# Patient Record
Sex: Female | Born: 1945 | Smoking: Never smoker
Health system: Southern US, Community
[De-identification: ages and names within clinical notes are randomized; demographics above are authoritative.]

## PROBLEM LIST (undated history)

## (undated) DIAGNOSIS — E039 Hypothyroidism, unspecified: Secondary | ICD-10-CM

## (undated) DIAGNOSIS — R079 Chest pain, unspecified: Secondary | ICD-10-CM

## (undated) DIAGNOSIS — M65331 Trigger finger, right middle finger: Secondary | ICD-10-CM

## (undated) DIAGNOSIS — E785 Hyperlipidemia, unspecified: Secondary | ICD-10-CM

## (undated) DIAGNOSIS — Z9889 Other specified postprocedural states: Secondary | ICD-10-CM

## (undated) DIAGNOSIS — R030 Elevated blood-pressure reading, without diagnosis of hypertension: Secondary | ICD-10-CM

## (undated) HISTORY — DX: Other specified postprocedural states: Z98.890

## (undated) HISTORY — DX: Trigger finger, right middle finger: M65.331

## (undated) HISTORY — DX: Chest pain, unspecified: R07.9

## (undated) HISTORY — PX: TRIGGER FINGER RELEASE: SHX641

## (undated) HISTORY — DX: Elevated blood-pressure reading, without diagnosis of hypertension: R03.0

---

## 2011-09-17 ENCOUNTER — Other Ambulatory Visit (HOSPITAL_COMMUNITY)
Admission: RE | Admit: 2011-09-17 | Discharge: 2011-09-17 | Disposition: A | Discharge status: Home / Self Care | Payer: Managed Care, Other (non HMO) | Source: Ambulatory Visit | Attending: Family Medicine | Admitting: Family Medicine

## 2011-09-17 DIAGNOSIS — Z124 Encounter for screening for malignant neoplasm of cervix: Secondary | ICD-10-CM | POA: Insufficient documentation

## 2014-09-25 ENCOUNTER — Other Ambulatory Visit (HOSPITAL_COMMUNITY)
Admission: RE | Admit: 2014-09-25 | Discharge: 2014-09-25 | Disposition: A | Discharge status: Home / Self Care | Payer: Medicare Other | Source: Ambulatory Visit | Attending: Family Medicine | Admitting: Family Medicine

## 2014-09-25 ENCOUNTER — Other Ambulatory Visit: Payer: Self-pay | Admitting: Family Medicine

## 2014-09-25 DIAGNOSIS — Z124 Encounter for screening for malignant neoplasm of cervix: Secondary | ICD-10-CM | POA: Diagnosis present

## 2014-09-30 LAB — CYTOLOGY - PAP

## 2017-12-26 ENCOUNTER — Ambulatory Visit (INDEPENDENT_AMBULATORY_CARE_PROVIDER_SITE_OTHER): Payer: Medicare Other | Admitting: Orthopaedic Surgery

## 2017-12-26 ENCOUNTER — Encounter (INDEPENDENT_AMBULATORY_CARE_PROVIDER_SITE_OTHER): Payer: Self-pay | Admitting: Orthopaedic Surgery

## 2017-12-26 DIAGNOSIS — M65331 Trigger finger, right middle finger: Secondary | ICD-10-CM | POA: Insufficient documentation

## 2017-12-26 HISTORY — DX: Trigger finger, right middle finger: M65.331

## 2017-12-26 NOTE — Progress Notes (Signed)
   Office Visit Note   Patient: Yolanda Webb           Date of Birth: Dec 29, 1945           MRN: [id number] Visit Date: 12/26/2017              Requested by: No referring provider defined for this encounter. PCP: Leighton Ruff, MD   Assessment & Plan: Visit Diagnoses:  1. Trigger finger, right middle finger     Plan: Impression is 72 year old female with recurrent right middle trigger finger.  At this point she wants to pursue surgical release and tenolysis of tendons.  We discussed the details of the surgery as well as the risk benefits and alternatives.  She understands and wishes to proceed.  We will get her scheduled at her convenience.   Follow-Up Instructions: Return for 2 week postop visit.   Orders:  No orders of the defined types were placed in this encounter.  No orders of the defined types were placed in this encounter.     Procedures: No procedures performed   Clinical Data: No additional findings.   Subjective: Chief Complaint  Patient presents with  . Right Middle Finger - Pain    Patient is following up today for recurrent right trigger middle finger.  She had partial relief from the previous injection.  She is interested in discussing surgery today.    Review of Systems   Objective: Vital Signs: There were no vitals taken for this visit.  Physical Exam  Ortho Exam Right hand exam shows triggering of the middle finger with pain and discomfort along the flexor tendon. Specialty Comments:  No specialty comments available.  Imaging: No results found.   PMFS History: Patient Active Problem List   Diagnosis Date Noted  . Trigger finger, right middle finger 12/26/2017   History reviewed. No pertinent past medical history.  History reviewed. No pertinent family history.  History reviewed. No pertinent surgical history. Social History   Occupational History  . Not on file  Tobacco Use  . Smoking status: Never Smoker  . Smokeless  tobacco: Never Used  Substance and Sexual Activity  . Alcohol use: Not on file  . Drug use: Not on file  . Sexual activity: Not on file

## 2018-01-06 ENCOUNTER — Other Ambulatory Visit: Payer: Self-pay

## 2018-01-06 DIAGNOSIS — M659 Synovitis and tenosynovitis, unspecified: Secondary | ICD-10-CM | POA: Diagnosis not present

## 2018-01-06 DIAGNOSIS — M65331 Trigger finger, right middle finger: Secondary | ICD-10-CM | POA: Diagnosis not present

## 2018-01-17 ENCOUNTER — Ambulatory Visit (INDEPENDENT_AMBULATORY_CARE_PROVIDER_SITE_OTHER): Payer: Medicare Other | Admitting: Orthopaedic Surgery

## 2018-01-17 ENCOUNTER — Encounter (INDEPENDENT_AMBULATORY_CARE_PROVIDER_SITE_OTHER): Payer: Self-pay | Admitting: Orthopaedic Surgery

## 2018-01-17 DIAGNOSIS — Z9889 Other specified postprocedural states: Secondary | ICD-10-CM

## 2018-01-17 HISTORY — DX: Other specified postprocedural states: Z98.890

## 2018-01-17 NOTE — Progress Notes (Signed)
   Post-Op Visit Note   Patient: Yolanda Webb           Date of Birth: [birth year]           MRN: [id number] Visit Date: 01/17/2018 PCP: Leighton Ruff, MD   Assessment & Plan:  Chief Complaint: No chief complaint on file.  Visit Diagnoses:  1. Status post trigger finger release     Plan: Jennefer comes in for follow-up.  11 days status post A1 pulley release right hand long finger date of surgery 01/06/2018.  She is doing very well.  Minimal to no pain.  No fevers chills or any other systemic symptoms.  She does have a a well-healing surgical incision without evidence of infection.  Nylon sutures intact.  She is able to fully extend and flex her right long finger.  She is neurovascular intact distally.  Today we will remove her nylon stitches and apply Steri-Strips.  We will provide her with range of motion exercises.  She will follow-up with [place] in 4-5 weeks time for repeat wound check.  Follow-Up Instructions: Return in about 5 years (around 01/17/2023).   Orders:  No orders of the defined types were placed in this encounter.  No orders of the defined types were placed in this encounter.   Imaging: No results found.  PMFS History: Patient Active Problem List   Diagnosis Date Noted  . Status post trigger finger release 01/17/2018  . Trigger finger, right middle finger 12/26/2017   History reviewed. No pertinent past medical history.  History reviewed. No pertinent family history.  History reviewed. No pertinent surgical history. Social History   Occupational History  . Not on file  Tobacco Use  . Smoking status: Never Smoker  . Smokeless tobacco: Never Used  Substance and Sexual Activity  . Alcohol use: Not on file  . Drug use: Not on file  . Sexual activity: Not on file

## 2018-02-14 ENCOUNTER — Ambulatory Visit (INDEPENDENT_AMBULATORY_CARE_PROVIDER_SITE_OTHER): Payer: Medicare Other | Admitting: Orthopaedic Surgery

## 2018-02-24 ENCOUNTER — Ambulatory Visit (INDEPENDENT_AMBULATORY_CARE_PROVIDER_SITE_OTHER): Payer: Medicare Other | Admitting: Orthopaedic Surgery

## 2018-02-27 ENCOUNTER — Encounter (INDEPENDENT_AMBULATORY_CARE_PROVIDER_SITE_OTHER): Payer: Self-pay | Admitting: Orthopaedic Surgery

## 2018-02-27 ENCOUNTER — Ambulatory Visit (INDEPENDENT_AMBULATORY_CARE_PROVIDER_SITE_OTHER): Payer: Medicare Other | Admitting: Orthopaedic Surgery

## 2018-02-27 DIAGNOSIS — Z9889 Other specified postprocedural states: Secondary | ICD-10-CM

## 2018-02-27 DIAGNOSIS — M65331 Trigger finger, right middle finger: Secondary | ICD-10-CM

## 2018-02-27 NOTE — Progress Notes (Signed)
Patient is 2 months status post right middle finger trigger release.  She is overall doing well.  Just complaining of some soreness around the surgical scar.  She no longer has triggering.  She is unable to fully extend her middle finger but this is not really overly bothersome.  From my standpoint everything is healed well.  She is doing well she is happy with her progress.  I will see her back as needed.

## 2018-10-31 ENCOUNTER — Observation Stay (HOSPITAL_COMMUNITY)
Admission: EM | Admit: 2018-10-31 | Discharge: 2018-11-01 | Disposition: A | Discharge status: Home / Self Care | Payer: Medicare Other | Attending: Internal Medicine | Admitting: Internal Medicine

## 2018-10-31 ENCOUNTER — Other Ambulatory Visit: Payer: Self-pay

## 2018-10-31 ENCOUNTER — Emergency Department (HOSPITAL_COMMUNITY): Payer: Medicare Other

## 2018-10-31 ENCOUNTER — Encounter (HOSPITAL_COMMUNITY): Payer: Self-pay

## 2018-10-31 DIAGNOSIS — Z8249 Family history of ischemic heart disease and other diseases of the circulatory system: Secondary | ICD-10-CM | POA: Insufficient documentation

## 2018-10-31 DIAGNOSIS — R0789 Other chest pain: Secondary | ICD-10-CM | POA: Diagnosis present

## 2018-10-31 DIAGNOSIS — R079 Chest pain, unspecified: Secondary | ICD-10-CM | POA: Diagnosis present

## 2018-10-31 DIAGNOSIS — E785 Hyperlipidemia, unspecified: Secondary | ICD-10-CM | POA: Diagnosis not present

## 2018-10-31 DIAGNOSIS — R030 Elevated blood-pressure reading, without diagnosis of hypertension: Secondary | ICD-10-CM

## 2018-10-31 DIAGNOSIS — I10 Essential (primary) hypertension: Secondary | ICD-10-CM | POA: Diagnosis not present

## 2018-10-31 DIAGNOSIS — M79602 Pain in left arm: Secondary | ICD-10-CM | POA: Insufficient documentation

## 2018-10-31 DIAGNOSIS — Z880 Allergy status to penicillin: Secondary | ICD-10-CM | POA: Insufficient documentation

## 2018-10-31 DIAGNOSIS — E039 Hypothyroidism, unspecified: Secondary | ICD-10-CM | POA: Diagnosis not present

## 2018-10-31 DIAGNOSIS — Z79899 Other long term (current) drug therapy: Secondary | ICD-10-CM | POA: Insufficient documentation

## 2018-10-31 HISTORY — DX: Hyperlipidemia, unspecified: E78.5

## 2018-10-31 HISTORY — DX: Hypothyroidism, unspecified: E03.9

## 2018-10-31 HISTORY — DX: Chest pain, unspecified: R07.9

## 2018-10-31 HISTORY — DX: Elevated blood-pressure reading, without diagnosis of hypertension: R03.0

## 2018-10-31 LAB — COMPREHENSIVE METABOLIC PANEL
ALT: 20 U/L (ref 0–44)
ANION GAP: 8 (ref 5–15)
AST: 23 U/L (ref 15–41)
Albumin: 4.4 g/dL (ref 3.5–5.0)
Alkaline Phosphatase: 76 U/L (ref 38–126)
BUN: 15 mg/dL (ref 8–23)
CHLORIDE: 108 mmol/L (ref 98–111)
CO2: 24 mmol/L (ref 22–32)
CREATININE: 1 mg/dL (ref 0.44–1.00)
Calcium: 10.5 mg/dL — ABNORMAL HIGH (ref 8.9–10.3)
GFR calc non Af Amer: 56 mL/min — ABNORMAL LOW (ref >60)
Glucose, Bld: 102 mg/dL — ABNORMAL HIGH (ref 70–99)
POTASSIUM: 3.8 mmol/L (ref 3.5–5.1)
SODIUM: 140 mmol/L (ref 135–145)
Total Bilirubin: 0.5 mg/dL (ref 0.3–1.2)
Total Protein: 7.5 g/dL (ref 6.5–8.1)

## 2018-10-31 LAB — CBC
HEMATOCRIT: 40.1 % (ref 36.0–46.0)
HEMOGLOBIN: 13.3 g/dL (ref 12.0–15.0)
MCH: 29.6 pg (ref 26.0–34.0)
MCHC: 33.2 g/dL (ref 30.0–36.0)
MCV: 89.3 fL (ref 80.0–100.0)
Platelets: 256 10*3/uL (ref 150–400)
RBC: 4.49 MIL/uL (ref 3.87–5.11)
RDW: 11.9 % (ref 11.5–15.5)
WBC: 7.2 10*3/uL (ref 4.0–10.5)
nRBC: 0 % (ref 0.0–0.2)

## 2018-10-31 LAB — RAPID URINE DRUG SCREEN, HOSP PERFORMED
Amphetamines: NOT DETECTED
BARBITURATES: NOT DETECTED
BENZODIAZEPINES: NOT DETECTED
COCAINE: NOT DETECTED
Opiates: NOT DETECTED
TETRAHYDROCANNABINOL: NOT DETECTED

## 2018-10-31 LAB — CBC WITH DIFFERENTIAL/PLATELET
ABS IMMATURE GRANULOCYTES: 0.03 10*3/uL (ref 0.00–0.07)
BASOS ABS: 0.1 10*3/uL (ref 0.0–0.1)
Basophils Relative: 1 %
Eosinophils Absolute: 0.1 10*3/uL (ref 0.0–0.5)
Eosinophils Relative: 2 %
HEMATOCRIT: 44 % (ref 36.0–46.0)
HEMOGLOBIN: 14.6 g/dL (ref 12.0–15.0)
IMMATURE GRANULOCYTES: 0 %
LYMPHS ABS: 1.6 10*3/uL (ref 0.7–4.0)
LYMPHS PCT: 23 %
MCH: 29.7 pg (ref 26.0–34.0)
MCHC: 33.2 g/dL (ref 30.0–36.0)
MCV: 89.4 fL (ref 80.0–100.0)
Monocytes Absolute: 0.8 10*3/uL (ref 0.1–1.0)
Monocytes Relative: 12 %
NEUTROS ABS: 4.3 10*3/uL (ref 1.7–7.7)
NEUTROS PCT: 62 %
NRBC: 0 % (ref 0.0–0.2)
Platelets: 291 10*3/uL (ref 150–400)
RBC: 4.92 MIL/uL (ref 3.87–5.11)
RDW: 11.8 % (ref 11.5–15.5)
WBC: 7 10*3/uL (ref 4.0–10.5)

## 2018-10-31 LAB — CREATININE, SERUM
CREATININE: 1.01 mg/dL — AB (ref 0.44–1.00)
GFR calc Af Amer: >60 mL/min (ref >60)
GFR, EST NON AFRICAN AMERICAN: 55 mL/min — AB (ref >60)

## 2018-10-31 LAB — I-STAT TROPONIN, ED: Troponin i, poc: 0.01 ng/mL (ref 0.00–0.08)

## 2018-10-31 LAB — TROPONIN I: Troponin I: <0.03 ng/mL (ref <0.03)

## 2018-10-31 LAB — TSH: TSH: 4.213 u[IU]/mL (ref 0.350–4.500)

## 2018-10-31 MED ORDER — ENOXAPARIN SODIUM 40 MG/0.4ML ~~LOC~~ SOLN
40.0000 mg | SUBCUTANEOUS | Status: DC
  Administered 2018-10-31: 40 mg via SUBCUTANEOUS
  Filled 2018-10-31: qty 0.4

## 2018-10-31 MED ORDER — ONDANSETRON HCL 4 MG/2ML IJ SOLN: 4.0000 mg | Freq: Four times a day (QID) | INTRAMUSCULAR | Status: DC | PRN

## 2018-10-31 MED ORDER — ASPIRIN EC 325 MG PO TBEC
325.0000 mg | DELAYED_RELEASE_TABLET | Freq: Every day | ORAL | Status: DC
  Administered 2018-10-31 – 2018-11-01 (×2): 325 mg via ORAL
  Filled 2018-10-31 (×2): qty 1

## 2018-10-31 MED ORDER — METOPROLOL TARTRATE 25 MG PO TABS
25.0000 mg | ORAL_TABLET | Freq: Two times a day (BID) | ORAL | Status: DC
  Administered 2018-11-01: 25 mg via ORAL
  Filled 2018-10-31: qty 1

## 2018-10-31 MED ORDER — NITROGLYCERIN 0.4 MG SL SUBL: 0.4000 mg | SUBLINGUAL_TABLET | SUBLINGUAL | Status: DC | PRN

## 2018-10-31 MED ORDER — ACETAMINOPHEN 325 MG PO TABS
650.0000 mg | ORAL_TABLET | ORAL | Status: DC | PRN
  Filled 2018-10-31: qty 2

## 2018-10-31 MED ORDER — MORPHINE SULFATE (PF) 2 MG/ML IV SOLN: 2.0000 mg | INTRAVENOUS | Status: DC | PRN

## 2018-10-31 NOTE — ED Provider Notes (Signed)
[facility] EMERGENCY DEPARTMENT Provider Note   CSN: [id number] Arrival date & time: 10/31/18  1618     History   Chief Complaint Chief Complaint  Patient presents with  . Hypertension  . Arm Pain    HPI Yolanda Webb is a 72 y.o. female who presents today for evaluation of left-sided elbow pain and hypertension.  She reports that she has been feeling lightheaded over the past few days.  She went to her primary care provider last week where she was noted to be hypertensive.  She has been monitoring her blood pressure at home and went to her primary care today who referred her to the emergency room due to her left arm pain and high blood pressure.  He does not have a previous history of high blood pressure.  She reports that she had a flu shot in the left arm on Thursday.  No recent coughs, fever, abdominal pain, nausea vomiting or diarrhea.  She reports feeling slightly light headed, currently nervous because she is in the ER.  She reports abnormal chest feeling.  However is not able to elaborate.   HPI  Past Medical History:  Diagnosis Date  . HLD (hyperlipidemia)   . Hypothyroid     Patient Active Problem List   Diagnosis Date Noted  . Chest pain 10/31/2018  . Status post trigger finger release 01/17/2018  . Trigger finger, right middle finger 12/26/2017    Past Surgical History:  Procedure Laterality Date  . TRIGGER FINGER RELEASE       OB History   None      Home Medications    Prior to Admission medications   Medication Sig Start Date End Date Taking? Authorizing Provider  atorvastatin (LIPITOR) 10 MG tablet Take 10 mg by mouth daily.  10/06/17  Yes [provider]  levothyroxine (SYNTHROID, LEVOTHROID) 75 MCG tablet Take 75 mcg by mouth daily.  10/23/17  Yes [provider]  Melatonin-Pyridoxine (MELATIN PO) Take 1 tablet by mouth at bedtime as needed (sleep).   Yes [provider]  VITAMIN D PO Take 2 tablets by  mouth daily.   Yes [provider]    Family History History reviewed. No pertinent family history.  Social History Social History   Tobacco Use  . Smoking status: Never Smoker  . Smokeless tobacco: Never Used  Substance Use Topics  . Alcohol use: Yes    Comment: 1 glass/day  . Drug use: Never     Allergies   Penicillins   Review of Systems Review of Systems  Constitutional: Negative for chills and fever.  Respiratory: Negative for cough, chest tightness and shortness of breath.   Cardiovascular: Negative for chest pain and palpitations.       Abnormal chest feeling  Gastrointestinal: Negative for abdominal pain, nausea and vomiting.  Neurological: Negative for weakness.       Slightly lightheaded  All other systems reviewed and are negative.    Physical Exam Updated Vital Signs BP (!) 154/77   Pulse 66   Temp 98 F (36.7 C)   Resp 19   Ht 5\' 3"  (1.6 m)   Wt 61.2 kg   SpO2 100%   BMI 23.91 kg/m   Physical Exam  Constitutional: She appears well-developed and well-nourished. No distress.  HENT:  Head: Normocephalic and atraumatic.  Mouth/Throat: Oropharynx is clear and moist.  Eyes: Conjunctivae are normal.  Neck: Neck supple. No JVD present. No tracheal deviation present.  Cardiovascular: Normal  rate, regular rhythm, normal heart sounds and intact distal pulses.  No murmur heard. Pulmonary/Chest: Effort normal and breath sounds normal. No stridor. No respiratory distress. She exhibits tenderness (Over left anterior chest).  Abdominal: Soft. Bowel sounds are normal. She exhibits no distension. There is no tenderness. There is no guarding.  Musculoskeletal: She exhibits no edema.  No deformity over left elbow.    Neurological: She is alert.  Skin: Skin is warm and dry. She is not diaphoretic.  Psychiatric:  Anxious  Nursing note and vitals reviewed.    ED Treatments / Results  Labs (all labs ordered are listed, but only abnormal results  are displayed) Labs Reviewed  COMPREHENSIVE METABOLIC PANEL - Abnormal; Notable for the following components:      Result Value   Glucose, Bld 102 (*)    Calcium 10.5 (*)    GFR calc non Af Amer 56 (*)    All other components within normal limits  CBC WITH DIFFERENTIAL/PLATELET  TSH  HIV ANTIBODY (ROUTINE TESTING W REFLEX)  TROPONIN I  TROPONIN I  CBC  CREATININE, SERUM  RAPID URINE DRUG SCREEN, HOSP PERFORMED  LIPID PANEL  I-STAT TROPONIN, ED    EKG EKG Interpretation  Date/Time:  Tuesday October 31 2018 16:28:36 EST Ventricular Rate:  61 PR Interval:    QRS Duration: 97 QT Interval:  441 QTC Calculation: 445 R Axis:   35 Text Interpretation:  Sinus rhythm Supraventricular bigeminy Probable anteroseptal infarct, old Nonspecific T abnormalities, lateral leads No old tracing to compare Confirmed by [place], Inocente Salles [phone]) on 10/31/2018 4:37:29 PM   Radiology Dg Chest 2 View  Result Date: 10/31/2018 CLINICAL DATA:  Hypertension and left arm pain. EXAM: CHEST - 2 VIEW COMPARISON:  None. FINDINGS: The heart size and mediastinal contours are within normal limits. Both lungs are clear. The visualized skeletal structures are unremarkable. IMPRESSION: No active cardiopulmonary disease. Electronically Signed   By: Ashley Royalty M.D.   On: 10/31/2018 17:07    Procedures Procedures (including critical care time)  Medications Ordered in ED    Initial Impression / Assessment and Plan / ED Course  I have reviewed the triage vital signs and the nursing notes.  Pertinent labs & imaging results that were available during my care of the patient were reviewed by me and considered in my medical decision making (see chart for details).  Clinical Course as of Oct 31 1917  Tue Oct 31, 2018  1829 Patient re-evaluated, pain is unchanged.     [EH]    Clinical Course User Index [EH] Lorin Glass, PA-C   Concern for cardiac etiology of Chest Pain.  Pt does not meet criteria for  CP protocol and a further evaluation is recommended. Pt has been re-evaluated prior to consult and VSS, NAD, heart RRR, pain 0/10, lungs CTAB. No acute abnormalities found on EKG and first round of cardiac enzymes negative. This case was discussed with Dr. Winfred Leeds who has seen the patient and agrees with plan to admit.    Final Clinical Impressions(s) / ED Diagnoses   Final diagnoses:  Pain of left upper extremity  Atypical chest pain    ED Discharge Orders    None       Ollen Gross 10/31/18 [place], MD 11/01/18 [phone]

## 2018-10-31 NOTE — H&P (Signed)
History and Physical    Yolanda Webb VOZ:[id number] DOB: 03/22/1946 DOA: 10/31/2018  PCP: Leighton Ruff, MD  patient coming from: PCP  I have personally briefly reviewed patient's old medical records in [place]  Chief Complaint: Arm pain chest tightness  HPI: Yolanda Webb is a 72 y.o. female with medical history significant of hyperlipidemia and hypothyroidism presents with chest  tightness and left arm pain.  Patient has noticed her blood pressure has been up  for about a week.  When she went for her regular follow-up  her blood pressure was in the 170s which is unlike her.  Today she went to  her PCP her blood pressure was in the 190 range per her report.  She had some left arm pain as well as some short-lived left arm tightness.  Due to combination of symptoms they referred over to the ED. When  She arrived here she was still hypertensive.  Her troponins are negative.  EKG nonacute.  She denies any  known history of coronary disease.  She does have extensive paternal family history of coronary artery disease in there  51s.  She denies any shortness of breath, leg pain, or swelling. ED Course: no Medical intervention in the ED  Review of Systems: Denies shortness of breath fever cough nausea vomiting abdominal pain positive for chest pain left arm pain  Past Medical History:  Diagnosis Date  . HLD (hyperlipidemia)   . Hypothyroid     Past Surgical History:  Procedure Laterality Date  . TRIGGER FINGER RELEASE       reports that she has never smoked. She has never used smokeless tobacco. She reports that she drinks alcohol. She reports that she does not use drugs.  Allergies  Allergen Reactions  . Penicillins Other (See Comments)    Family History  Problem Relation Age of Onset  . CAD Father   . Hypertension Sister      Prior to Admission medications   Medication Sig Start Date End Date Taking? Authorizing Provider  atorvastatin (LIPITOR) 10 MG tablet Take  10 mg by mouth daily.  10/06/17  Yes [provider]  levothyroxine (SYNTHROID, LEVOTHROID) 75 MCG tablet Take 75 mcg by mouth daily.  10/23/17  Yes [provider]  Melatonin-Pyridoxine (MELATIN PO) Take 1 tablet by mouth at bedtime as needed (sleep).   Yes [provider]  VITAMIN D PO Take 2 tablets by mouth daily.   Yes [provider]    Physical Exam: Vitals:   10/31/18 1917 10/31/18 2007 10/31/18 2156 10/31/18 2200  BP: (!) 165/85 (!) 170/80 (!) 152/74   Pulse: 64 63 60 (!) 59  Resp: 18 18 18    Temp:  98.1 F (36.7 C)    TempSrc:  Oral    SpO2: 98% 100% 99%   Weight:  61.2 kg    Height:  5\' 3"  (1.6 m)      Constitutional: NAD, calm, comfortable Vitals:   10/31/18 1917 10/31/18 2007 10/31/18 2156 10/31/18 2200  BP: (!) 165/85 (!) 170/80 (!) 152/74   Pulse: 64 63 60 (!) 59  Resp: 18 18 18    Temp:  98.1 F (36.7 C)    TempSrc:  Oral    SpO2: 98% 100% 99%   Weight:  61.2 kg    Height:  5\' 3"  (1.6 m)     Eyes: PERRL, lids and conjunctivae normal ENMT: Mucous membranes are moist. Posterior pharynx clear of any exudate or lesions.Normal dentition.  Neck:  normal, supple,  Respiratory: clear to auscultation bilaterally, no wheezing, no crackles. Normal respiratory effort. No accessory muscle use.  Cardiovascular: Regular rate and rhythm, no murmurs / rubs / gallops. No extremity edema. 2+ pedal pulses.  Abdomen: no tenderness, no masses palpated. No hepatosplenomegaly. Bowel sounds positive.  Psychiatric: Normal judgment and insight. Alert and oriented x 3. Normal mood.     Labs on Admission: I have personally reviewed following labs and imaging studies  CBC: Recent Labs  Lab 10/31/18 1633 10/31/18 1900  WBC 7.0 7.2  NEUTROABS 4.3  --   HGB 14.6 13.3  HCT 44.0 40.1  MCV 89.4 89.3  PLT 291 809   Basic Metabolic Panel: Recent Labs  Lab 10/31/18 1633 10/31/18 1900  NA 140  --   K 3.8  --   CL 108  --   CO2 24  --     GLUCOSE 102*  --   BUN 15  --   CREATININE 1.00 1.01*  CALCIUM 10.5*  --    GFR: Estimated Creatinine Clearance: 42.9 mL/min (A) (by C-G formula based on SCr of 1.01 mg/dL (H)). Liver Function Tests: Recent Labs  Lab 10/31/18 1633  AST 23  ALT 20  ALKPHOS 76  BILITOT 0.5  PROT 7.5  ALBUMIN 4.4   No results for input(s): LIPASE, AMYLASE in the last 168 hours. No results for input(s): AMMONIA in the last 168 hours. Coagulation Profile: No results for input(s): INR, PROTIME in the last 168 hours. Cardiac Enzymes: Recent Labs  Lab 10/31/18 1900 10/31/18 2133  TROPONINI <0.03 <0.03   BNP (last 3 results) No results for input(s): PROBNP in the last 8760 hours. HbA1C: No results for input(s): HGBA1C in the last 72 hours. CBG: No results for input(s): GLUCAP in the last 168 hours. Lipid Profile: No results for input(s): CHOL, HDL, LDLCALC, TRIG, CHOLHDL, LDLDIRECT in the last 72 hours. Thyroid Function Tests: Recent Labs    10/31/18 1708  TSH 4.213   Anemia Panel: No results for input(s): VITAMINB12, FOLATE, FERRITIN, TIBC, IRON, RETICCTPCT in the last 72 hours. Urine analysis: No results found for: COLORURINE, APPEARANCEUR, [place], [place], [place], [place], [place], [place], [place], [place], NITRITE, LEUKOCYTESUR  Radiological Exams on Admission: Dg Chest 2 View  Result Date: 10/31/2018 CLINICAL DATA:  Hypertension and left arm pain. EXAM: CHEST - 2 VIEW COMPARISON:  None. FINDINGS: The heart size and mediastinal contours are within normal limits. Both lungs are clear. The visualized skeletal structures are unremarkable. IMPRESSION: No active cardiopulmonary disease. Electronically Signed   By: Ashley Royalty M.D.   On: 10/31/2018 17:07    EKG: Independently reviewed.  Sinus rhythm with frequent PACs no acute changes  Assessment/Plan Principal Problem:   Chest pain Elevated BP Active Problems:   Hyperlipidemia   Hypothyroid  -Admit to telemetry  for observation, cycle cardiac enzymes, aspirin, statin,  beta-blocker as tolerated, check fasting lipid panel, consult cardiology  -Elevated blood pressure with no prior history of hypertension.  He does have a history of hypertension.  Remains elevated here.  Beta-blocker as above follow.  -Continue home thyroid replacement TSH  DVT prophylaxis: Geralynn Ochs  Code Status: Full Disposition Plan: Home 1 day  Admission status: Observation telemetry   Aaleigha Bozza Johnson-Pitts MD [facility] Pager [phone]  If 7PM-7AM, please contact night-coverage [email or web address] Password [facility]  10/31/2018, 10:56 PM

## 2018-10-31 NOTE — ED Triage Notes (Addendum)
Pt BIB POV for eval of hypertension and L arm pain. Pt reports she checked her BP at home and noted that her BP was high. She went to PCP today and they sent her here for further eval d/t the L arm pain and HTN. Pt denies chest pain, N/T, N/V. Endorses mild SOB. Pt reports intermittent dizziness as well. Pt reports she did receive a flu shot to the L arm on Thursday. No focal neuro deficits on exam.

## 2018-10-31 NOTE — ED Provider Notes (Signed)
Patient complains of lightheadedness and left elbow pain constant for the past 2 days.  She also admits to mild dyspnea.  She saw her PCP a few days ago and was told that her blood pressurewas high, and that it should be "monitored".  Symptoms nonexertional.  No treatment prior to coming here.  On exam alert appears anxious.  HEENT exam normocephalic atraumatic.  No facial asymmetry neck supple no bruit lungs clear to auscultation heart regular rate and rhythm abdomen nondistended nontender all 4 extremities without redness swelling or tenderness neurovascular intact.  Radial pulses 2+ bilaterally.   Orlie Dakin, MD 11/01/18 [phone]

## 2018-11-01 DIAGNOSIS — R030 Elevated blood-pressure reading, without diagnosis of hypertension: Secondary | ICD-10-CM | POA: Diagnosis not present

## 2018-11-01 DIAGNOSIS — R0789 Other chest pain: Secondary | ICD-10-CM | POA: Diagnosis not present

## 2018-11-01 DIAGNOSIS — R079 Chest pain, unspecified: Secondary | ICD-10-CM | POA: Diagnosis not present

## 2018-11-01 LAB — LIPID PANEL
CHOL/HDL RATIO: 4 ratio
Cholesterol: 166 mg/dL (ref 0–200)
HDL: 42 mg/dL (ref >40)
LDL CALC: 94 mg/dL (ref 0–99)
Triglycerides: 151 mg/dL — ABNORMAL HIGH (ref <150)
VLDL: 30 mg/dL (ref 0–40)

## 2018-11-01 LAB — TSH: TSH: 3.852 u[IU]/mL (ref 0.350–4.500)

## 2018-11-01 LAB — HEMOGLOBIN A1C
HEMOGLOBIN A1C: 5.5 % (ref 4.8–5.6)
Mean Plasma Glucose: 111.15 mg/dL

## 2018-11-01 LAB — HIV ANTIBODY (ROUTINE TESTING W REFLEX): HIV Screen 4th Generation wRfx: NONREACTIVE

## 2018-11-01 MED ORDER — LOSARTAN POTASSIUM 25 MG PO TABS
25.0000 mg | ORAL_TABLET | Freq: Every day | ORAL | Status: DC
  Administered 2018-11-01: 25 mg via ORAL
  Filled 2018-11-01: qty 1

## 2018-11-01 MED ORDER — LOSARTAN POTASSIUM 25 MG PO TABS: 25.0000 mg | ORAL_TABLET | Freq: Every day | ORAL | 0 refills | Status: DC

## 2018-11-01 MED ORDER — METOPROLOL TARTRATE 25 MG PO TABS: 12.5000 mg | ORAL_TABLET | Freq: Two times a day (BID) | ORAL | 0 refills | Status: DC

## 2018-11-01 NOTE — Discharge Summary (Signed)
Physician Discharge Summary  Yolanda Webb NKN:[id number] DOB: [birth year] DOA: 10/31/2018  PCP: Leighton Ruff, MD  Admit date: 10/31/2018 Discharge date: 11/01/2018  Admitted From: Home Disposition: Home  Recommendations for Outpatient Follow-up:  1. Follow up with PCP in 1-2 weeks 2. Please obtain BMP/CBC in one week your next doctors visit.  3. Follow-up with outpatient cardiology as recommended 4. Losartan 25 mg orally prescribed, slightly lowered metoprolol dose to 12.5 mg twice daily.  This can be titrated outpatient.  Discharge Condition: Stable CODE STATUS: Full code Diet recommendation: 2 g salt diet  Brief/Interim Summary: 72 year old with history of hyperlipidemia, hypothyroidism came to the hospital with complains of chest tightness and left arm pain.  Her blood pressure was slightly elevated during her hospitalization as well.  Cardiology was consulted.  Her troponin remained flat and it was recommended to add losartan 25 mg.  Patient is not a smoker or diabetic.  Cardiology thought her chest discomfort could be secondary to her blood pressure therefore needs to be addressed first.  At this time she is a maximum benefit from hospital stay and stable to be discharged with outpatient follow-up recommendations as stated above.  She also needs to follow-up with outpatient cardiology in 2-4 weeks.    Discharge Diagnoses:  Principal Problem:   Chest pain Active Problems:   Hyperlipidemia   Hypothyroid   Elevated BP without diagnosis of hypertension  Atypical chest pain, resolved -Patient's cardiac enzymes remain negative.  Thought this was secondary due to elevated blood pressure but now it has resolved.  She may benefit from outpatient stress test or coronary CT if necessary in the future.  Appreciate cardiology input.  Essential hypertension, uncontrolled - Losartan 25 mg orally added.  Metoprolol slightly decreased to 12.5 mg twice daily.  If necessary we can add  outpatient low-dose hydrochlorothiazide.  Patient was on Lovenox for DVT prophylaxis while here Full code Discharge her today in stable condition.  Discharge Instructions   Allergies as of 11/01/2018      Reactions   Penicillins Other (See Comments)      Medication List    TAKE these medications   atorvastatin 10 MG tablet Commonly known as:  LIPITOR Take 10 mg by mouth daily.   levothyroxine 75 MCG tablet Commonly known as:  SYNTHROID, LEVOTHROID Take 75 mcg by mouth daily.   losartan 25 MG tablet Commonly known as:  COZAAR Take 1 tablet (25 mg total) by mouth daily.   MELATIN PO Take 1 tablet by mouth at bedtime as needed (sleep).   metoprolol tartrate 25 MG tablet Commonly known as:  LOPRESSOR Take 0.5 tablets (12.5 mg total) by mouth 2 (two) times daily.   VITAMIN D PO Take 2 tablets by mouth daily.      Follow-up Information    Jerline Pain, MD Follow up.   Specialty:  Cardiology Why:  Please keep scheduled hospital follow-up visit with Dr. Marlou Porch on 11/27/2018 as listed below. Contact information: [address] [place] [address] [phone]        Leighton Ruff, MD. Schedule an appointment as soon as possible for a visit in 1 week(s).   Specialty:  Family Medicine Contact information: [place] [address] [phone]          Allergies  Allergen Reactions  . Penicillins Other (See Comments)    You were cared for by a hospitalist during your hospital stay. If you have any questions about your discharge medications  or the care you received while you were in the hospital after you are discharged, you can call the unit and asked to speak with the hospitalist on call if the hospitalist that took care of you is not available. Once you are discharged, your primary care physician will handle any further medical issues. Please note that no refills for any discharge medications will be authorized once  you are discharged, as it is imperative that you return to your primary care physician (or establish a relationship with a primary care physician if you do not have one) for your aftercare needs so that they can reassess your need for medications and monitor your lab values.  Consultations:  Cardiology   Procedures/Studies: Dg Chest 2 View  Result Date: 10/31/2018 CLINICAL DATA:  Hypertension and left arm pain. EXAM: CHEST - 2 VIEW COMPARISON:  None. FINDINGS: The heart size and mediastinal contours are within normal limits. Both lungs are clear. The visualized skeletal structures are unremarkable. IMPRESSION: No active cardiopulmonary disease. Electronically Signed   By: Ashley Royalty M.D.   On: 10/31/2018 17:07      Subjective: Patient is currently chest pain-free and does not have any complaints.  General = no fevers, chills, dizziness, malaise, fatigue HEENT/EYES = negative for pain, redness, loss of vision, double vision, blurred vision, loss of hearing, sore throat, hoarseness, dysphagia Cardiovascular= negative for chest pain, palpitation, murmurs, lower extremity swelling Respiratory/lungs= negative for shortness of breath, cough, hemoptysis, wheezing, mucus production Gastrointestinal= negative for nausea, vomiting,, abdominal pain, melena, hematemesis Genitourinary= negative for Dysuria, Hematuria, Change in Urinary Frequency MSK = Negative for arthralgia, myalgias, Back Pain, Joint swelling  Neurology= Negative for headache, seizures, numbness, tingling  Psychiatry= Negative for anxiety, depression, suicidal and homocidal ideation Allergy/Immunology= Medication/Food allergy as listed  Skin= Negative for Rash, lesions, ulcers, itching    Discharge Exam: Vitals:   11/01/18 0414 11/01/18 0950  BP: 139/74 (!) 145/76  Pulse: 64 68  Resp: 18 16  Temp: 98.6 F (37 C) 98.1 F (36.7 C)  SpO2: 98% 98%   Vitals:   10/31/18 2200 11/01/18 0007 11/01/18 0414 11/01/18 0950  BP:   (!) 141/87 139/74 (!) 145/76  Pulse: (!) 59 64 64 68  Resp:  16 18 16   Temp:  98.3 F (36.8 C) 98.6 F (37 C) 98.1 F (36.7 C)  TempSrc:  Oral Oral Oral  SpO2:  98% 98% 98%  Weight:      Height:        General: Pt is alert, awake, not in acute distress Cardiovascular: RRR, S1/S2 +, no rubs, no gallops Respiratory: CTA bilaterally, no wheezing, no rhonchi Abdominal: Soft, NT, ND, bowel sounds + Extremities: no edema, no cyanosis    The results of significant diagnostics from this hospitalization (including imaging, microbiology, ancillary and laboratory) are listed below for reference.     Microbiology: No results found for this or any previous visit (from the past 240 hour(s)).   Labs: BNP (last 3 results) No results for input(s): BNP in the last 8760 hours. Basic Metabolic Panel: Recent Labs  Lab 10/31/18 1633 10/31/18 1900  NA 140  --   K 3.8  --   CL 108  --   CO2 24  --   GLUCOSE 102*  --   BUN 15  --   CREATININE 1.00 1.01*  CALCIUM 10.5*  --    Liver Function Tests: Recent Labs  Lab 10/31/18 1633  AST 23  ALT 20  ALKPHOS 76  BILITOT 0.5  PROT 7.5  ALBUMIN 4.4   No results for input(s): LIPASE, AMYLASE in the last 168 hours. No results for input(s): AMMONIA in the last 168 hours. CBC: Recent Labs  Lab 10/31/18 1633 10/31/18 1900  WBC 7.0 7.2  NEUTROABS 4.3  --   HGB 14.6 13.3  HCT 44.0 40.1  MCV 89.4 89.3  PLT 291 256   Cardiac Enzymes: Recent Labs  Lab 10/31/18 1900 10/31/18 2133  TROPONINI <0.03 <0.03   BNP: Invalid input(s): POCBNP CBG: No results for input(s): GLUCAP in the last 168 hours. D-Dimer No results for input(s): DDIMER in the last 72 hours. Hgb A1c Recent Labs    11/01/18 0810  HGBA1C 5.5   Lipid Profile Recent Labs    11/01/18 0337  CHOL 166  HDL 42  LDLCALC 94  TRIG 151*  CHOLHDL 4.0   Thyroid function studies Recent Labs    11/01/18 0337  TSH 3.852   Anemia work up No results for input(s):  VITAMINB12, FOLATE, FERRITIN, TIBC, IRON, RETICCTPCT in the last 72 hours. Urinalysis No results found for: COLORURINE, APPEARANCEUR, LABSPEC, [place], GLUCOSEU, HGBUR, BILIRUBINUR, KETONESUR, PROTEINUR, UROBILINOGEN, NITRITE, LEUKOCYTESUR Sepsis Labs Invalid input(s): PROCALCITONIN,  WBC,  LACTICIDVEN Microbiology No results found for this or any previous visit (from the past 240 hour(s)).   Time coordinating discharge:  I have spent 35 minutes face to face with the patient and on the ward discussing the patients care, assessment, plan and disposition with other care givers. >50% of the time was devoted counseling the patient about the risks and benefits of treatment/Discharge disposition and coordinating care.   SIGNED:   Damita Lack, MD  [facility] 11/01/2018, 11:45 AM Pager   If 7PM-7AM, please contact night-coverage [email or web address] Password [email or web address]

## 2018-11-01 NOTE — Consult Note (Addendum)
Cardiology Consult    Yolanda Webb ID: Yolanda Webb MRN: [id number], DOB/AGE: 70-06-1946   Admit date: 10/31/2018 Date of Consult: 11/01/2018  Primary Physician: Leighton Ruff, MD Primary Cardiologist: Candee Furbish, MD (New Consult)  Yolanda Webb Profile    Yolanda Webb is a 72 y.o. female with a history of hyperlipidemia and hypothyroidism, who is being seen today for the evaluation of chest pain at the request of Dr. Baron Hamper.  History of Present Illness    Yolanda Webb is a 72 year old female with no known CAD. Yolanda Webb previously saw a Film/video editor while living in [place]. Yolanda Webb cannot remember what Yolanda Webb saw the Cardiologist for but does remember having a stress test about 15-20 years ago.  Yolanda Webb reports her BP has been elevated over the last week. Yolanda Webb saw her PCP yesterday and her systolic BP was reportedly in the 190s. Yolanda Webb also was having some left arm pain/tightness so her PCP recommended Yolanda Webb come to the ED. Yolanda Webb reports intermittent left arm chest discomfort/pain for the last 3-4 days that gradually comes on and then resolves independently. Pain became more pronounced yesterday. Yolanda Webb also reports chest discomfort for the past 2 days that Yolanda Webb describes as an "aching" feeling. Yolanda Webb denies any diaphoresis, shortness of breath, nausea/vomiting, or lightheadedness/dizziness. Pain is worse when taking deep breaths but is not related to exertion, positions, or meals. Yolanda Webb denies any recent illness but does report recent fatigue and a decreased appetite.    Upon arrival to the ED, Yolanda Webb hypertensive at 189/149 mmHg but vitals stable. EKG showed sinus rhythm, rate 61 bpm, with bigeminy PACs and nonspecific ST/T changes in lateral leads. Troponin negative x3. Chest x-ray showed no acute findings. CBC unremarkable. Na 140, K 3.8, Glucose 102, SCr 1.0.   Currently, Yolanda Webb denies any chest or left arm discomfort.    Yolanda Webb denies any tobacco, alcohol, or drug use. Yolanda Webb does have a  significant family history of heart disease on her father's side. Her father had his first MI at 63 years old and died at age 60 due to CHF. Yolanda Webb's has 5 paternal aunt and uncles who all died before the age of 70 due to heart disease (mostly CAD and CHF).   Past Medical History   Past Medical History:  Diagnosis Date  . HLD (hyperlipidemia)   . Hypothyroid     Past Surgical History:  Procedure Laterality Date  . TRIGGER FINGER RELEASE       Allergies  Allergies  Allergen Reactions  . Penicillins Other (See Comments)    Inpatient Medications    . aspirin EC  325 mg Oral Daily  . enoxaparin (LOVENOX) injection  40 mg Subcutaneous Q24H  . metoprolol tartrate  25 mg Oral BID    Family History    Family History  Problem Relation Age of Onset  . CAD Father   . Hypertension Sister    Yolanda Webb indicated that the status of her father is unknown. Yolanda Webb indicated that the status of her sister is unknown.   Social History    Social History   Socioeconomic History  . Marital status: Unknown    Spouse name: Not on file  . Number of children: Not on file  . Years of education: Not on file  . Highest education level: Not on file  Occupational History  . Occupation: retired but PT now Development worker, community  Social Needs  . Financial resource strain: Not on file  . Food insecurity:    Worry: Not on file  Inability: Not on file  . Transportation needs:    Medical: Not on file    Non-medical: Not on file  Tobacco Use  . Smoking status: Never Smoker  . Smokeless tobacco: Never Used  Substance and Sexual Activity  . Alcohol use: Yes    Comment: 1 glass/day  . Drug use: Never  . Sexual activity: Not on file  Lifestyle  . Physical activity:    Days per week: Not on file    Minutes per session: Not on file  . Stress: Not on file  Relationships  . Social connections:    Talks on phone: Not on file    Gets together: Not on file    Attends religious service: Not on file     Active member of club or organization: Not on file    Attends meetings of clubs or organizations: Not on file    Relationship status: Not on file  . Intimate partner violence:    Fear of current or ex partner: Not on file    Emotionally abused: Not on file    Physically abused: Not on file    Forced sexual activity: Not on file  Other Topics Concern  . Not on file  Social History Narrative  . Not on file     Review of Systems    Review of Systems  Constitutional: Positive for malaise/fatigue. Negative for chills, diaphoresis and fever.  HENT: Negative for congestion and sore throat.   Eyes: Negative for blurred vision and double vision.  Respiratory: Negative for cough, hemoptysis and shortness of breath.   Cardiovascular: Positive for chest pain. Negative for palpitations, orthopnea, leg swelling and PND.  Gastrointestinal: Negative for abdominal pain, blood in stool, nausea and vomiting.  Genitourinary: Negative for hematuria.  Musculoskeletal: Negative for falls and myalgias.  Neurological: Negative for dizziness, loss of consciousness and weakness.  Endo/Heme/Allergies: Negative for environmental allergies. Does not bruise/bleed easily.  Psychiatric/Behavioral: Negative for substance abuse.  All other systems reviewed and are negative.   Physical Exam    Blood pressure 139/74, pulse 64, temperature 98.6 F (37 C), temperature source Oral, resp. rate 18, height 5\' 3"  (1.6 m), weight 61.2 kg, SpO2 98 %.  General: 72 y.o. Caucasian female resting comfortably in no acute distress. Pleasant and cooperative. HEENT: Normal  Neck: Supple. No carotid bruits or JVD appreciated. Lungs: No increased work of breathing. Clear to auscultation bilaterally. No wheezes, rhonchi, or rales. Heart: RRR. Distinct S1 and S2. Soft systolic murmur best heard at left upper sternal border. No gallops, or rubs.  Abdomen: Soft, non-distended, and non-tender to palpation. Bowel sounds  present. Extremities: No lower extremity edema. Distal pedal and radial pulses 2+ and equal bilaterally. Neuro: Alert and oriented x3. No focal deficits. Moves all extremities spontaneously. Psych: Normal affect.  Labs    Troponin [facility] of Care Test) Recent Labs    10/31/18 1637  TROPIPOC 0.01   Recent Labs    10/31/18 1900 10/31/18 2133  TROPONINI <0.03 <0.03   Lab Results  Component Value Date   WBC 7.2 10/31/2018   HGB 13.3 10/31/2018   HCT 40.1 10/31/2018   MCV 89.3 10/31/2018   PLT 256 10/31/2018    Recent Labs  Lab 10/31/18 1633 10/31/18 1900  NA 140  --   K 3.8  --   CL 108  --   CO2 24  --   BUN 15  --   CREATININE 1.00 1.01*  CALCIUM 10.5*  --  PROT 7.5  --   BILITOT 0.5  --   ALKPHOS 76  --   ALT 20  --   AST 23  --   GLUCOSE 102*  --    Lab Results  Component Value Date   CHOL 166 11/01/2018   HDL 42 11/01/2018   LDLCALC 94 11/01/2018   TRIG 151 (H) 11/01/2018   No results found for: [facility]   Radiology Studies    Dg Chest 2 View  Result Date: 10/31/2018 CLINICAL DATA:  Hypertension and left arm pain. EXAM: CHEST - 2 VIEW COMPARISON:  None. FINDINGS: The heart size and mediastinal contours are within normal limits. Both lungs are clear. The visualized skeletal structures are unremarkable. IMPRESSION: No active cardiopulmonary disease. Electronically Signed   By: Ashley Royalty M.D.   On: 10/31/2018 17:07    EKG     EKG: EKG was personally reviewed and demonstrates: normal sinus rhythm, rate 61 bpm, with bigeminy PACs and nonspecific ST/T changes in lateral leads   Telemetry: Telemetry was personally reviewed and demonstrates: sinus rhythm with frequent PACs  Cardiac Imaging    None.  Assessment & Plan    1. Chest Pain - Yolanda Webb presents with chest discomfort that Yolanda Webb describes as an "ache" as well as left arm pain. - EKG showed no acute ischemic changes. - Troponin negative x3. - Yolanda Webb's BP has been elevated over the last week  with systolic BP as high as the 190's. BP currently 139/74.  - Yolanda Webb denies any discomfort at this time. - Chest pain may be secondary to elevated BP; however, Yolanda Webb does have history of hyperlipidemia and a significant family history of heart disease. May consider ischemic workup with coronary CT or stress test. Also may be reasonable to control BP and proceed with outpatient workup if pain continues given negative EKG and troponin. Will discuss with MD.  2. Elevated BP - BP 189/148 upon arrival to ED. Has been has high as 208/110. - Lopressor 25mg  twice daily has been ordered but it does not appear that Yolanda Webb has received it last night due to HR being <60 bpm. Next dose scheduled for 10:00am. - Most recent BP okay at 139/74. - May consider trying Amlodipine 5mg  daily.   3. Hyperlipidemia - LDL 94 this admission. - Currently on Lipitor 10mg  daily at home. May need to increase this dose.   4. Hypothyroidism  - TSH normal. - Continue synthroid per primary team.  Signed, Darreld Mclean, PA-C 11/01/2018, 8:53 AM  For questions or updates, please contact   Please consult [email or web address] for contact info under Cardiology/STEMI.  72 year old with history of hyperlipidemia, recent increase in blood pressure, family history of MI on her father's side here with left arm and chest discomfort on and off over the past several days.  Her husband has a blood pressure cuff and it was quite high at home no smoking, nondiabetic  GEN: Well nourished, well developed, in no acute distress  HEENT: normal  Neck: no JVD, carotid bruits, or masses Cardiac: RRR; no murmurs, rubs, or gallops,no edema  Respiratory:  clear to auscultation bilaterally, normal work of breathing GI: soft, nontender, nondistended, + BS MS: no deformity or atrophy  Skin: warm and dry, no rash Neuro:  Alert and Oriented x 3, Strength and sensation are intact Psych: euthymic mood, full affect  Troponins negative x3.  LDL  94  Assessment and plan  Chest pain - Could be related to blood pressure, could have underlying coronary  artery disease given her family history. - I would like to first treat her blood pressure aggressively and then see her back in clinic.  At that time, we can consider further testing with either coronary CT scan or perhaps stress testing.  We discussed the other option of just getting further testing done here in the hospital but Yolanda Webb would like to go home if possible.  This seems reasonable.  Elevated blood pressure/hypertension - Most recently blood pressure was in the 139/74 range. - I will add losartan 25 mg.  Yolanda Webb will continue to monitor at home and update me.  If necessary, we can add a low-dose diuretic such as HCTZ 12.5 mg to the losartan. -Okay to continue with metoprolol 25 mg twice a day as well.  We will see her back in clinic in approximately 2-4 weeks.  Of course, if Yolanda Webb has further discomfort Yolanda Webb understands to seek further medical attention.  Okay for discharge  Candee Furbish, MD

## 2018-11-01 NOTE — Plan of Care (Signed)
  Yolanda Webb QIO:[id number] DOB: 1946/01/19 DOA: 10/31/2018  Chief Complaint: Arm pain chest tightness       Past Medical History:  Diagnosis Date   HLD (hyperlipidemia)     Hypothyroid             Past Surgical History:  Procedure Laterality Date   TRIGGER FINGER RELEASE              Allergies  Allergen Reactions   Penicillins Other (See Comments)           Family History  Problem Relation Age of Onset   CAD Father     Hypertension Sister      Pt verbalized understanding of care plan. Pt alert and oriented x4. Skin warm and dry. Respirations equal and unlabored. Pt slept comfortable most of the night. Pt states has slight discomfort of chest pain during admission. Pt hypertensive when initially reached to unit. After rest BP decreased. HR while laying in bed resting is usually low 60s. Metoprolol not given during arrival due to order parameters not being met. PT understands cardiac disease and rick reduction. Pt able to manage health care related needs upon discharge. Adequate nutrition maintained. Morning breakfast to be held until cardiology consult.

## 2018-11-27 ENCOUNTER — Encounter: Payer: Self-pay | Admitting: Cardiology

## 2018-11-27 ENCOUNTER — Ambulatory Visit: Payer: Medicare Other | Admitting: Cardiology

## 2018-11-27 VITALS — BP 144/80 | HR 70 | Ht 63.0 in | Wt 138.8 lb

## 2018-11-27 DIAGNOSIS — R079 Chest pain, unspecified: Secondary | ICD-10-CM

## 2018-11-27 MED ORDER — LOSARTAN POTASSIUM 50 MG PO TABS: 50.0000 mg | ORAL_TABLET | Freq: Every day | ORAL | 3 refills | Status: AC

## 2018-11-27 MED ORDER — HYDROCHLOROTHIAZIDE 25 MG PO TABS: 12.5000 mg | ORAL_TABLET | Freq: Every day | ORAL | 3 refills | Status: DC

## 2018-11-27 NOTE — Progress Notes (Signed)
Cardiology Office Note:    Date:  11/27/2018   ID:  Shauniece Kwan, DOB 1946-05-03, MRN [id number]  PCP:  Leighton Ruff, MD  Cardiologist:  Candee Furbish, MD  Electrophysiologist:  None   Referring MD: Leighton Ruff, MD     History of Present Illness:    Yolanda Webb is a 72 y.o. female here for follow-up of chest pain seen in the emergency department.  On 11/01/2018: Consult note as follows  72 year old with history of hyperlipidemia, recent increase in blood pressure, family history of MI on her father's side here with left arm and chest discomfort on and off over the past several days.  Her husband has a blood pressure cuff and it was quite high at home no smoking, nondiabetic  GEN: Well nourished, well developed, in no acute distress  HEENT: normal  Neck: no JVD, carotid bruits, or masses Cardiac: RRR; no murmurs, rubs, or gallops,no edema  Respiratory:  clear to auscultation bilaterally, normal work of breathing GI: soft, nontender, nondistended, + BS MS: no deformity or atrophy  Skin: warm and dry, no rash Neuro:  Alert and Oriented x 3, Strength and sensation are intact Psych: euthymic mood, full affect  Troponins negative x3.  LDL 94  Assessment and plan  Chest pain - Could be related to blood pressure, could have underlying coronary artery disease given her family history. - I would like to first treat her blood pressure aggressively and then see her back in clinic.  At that time, we can consider further testing with either coronary CT scan or perhaps stress testing.  We discussed the other option of just getting further testing done here in the hospital but she would like to go home if possible.  This seems reasonable.  Elevated blood pressure/hypertension - Most recently blood pressure was in the 139/74 range. - I will add losartan 25 mg.  She will continue to monitor at home and update me.  If necessary, we can add a low-dose diuretic such as HCTZ 12.5  mg to the losartan. -Okay to continue with metoprolol 25 mg twice a day as well.  We will see her back in clinic in approximately 2-4 weeks.  Of course, if she has further discomfort she understands to seek further medical attention.  Okay for discharge"    Had seen Dr. Drema Dallas. BP still a bit high. Nov 20, 146/72. Rash on left arm after hospital. Shingles. That is reason for arm pain. Took Valtrex.  Overall she has been doing quite well.  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  Stays quite active.  Clearly, the correlation with the symptoms were shingles.  Past Medical History:  Diagnosis Date  . Chest pain 10/31/2018  . Elevated BP without diagnosis of hypertension 10/31/2018  . HLD (hyperlipidemia)   . Hypothyroid   . Status post trigger finger release 01/17/2018  . Trigger finger, right middle finger 12/26/2017    Past Surgical History:  Procedure Laterality Date  . TRIGGER FINGER RELEASE      Current Medications: Current Meds  Medication Sig  . atorvastatin (LIPITOR) 10 MG tablet Take 10 mg by mouth daily.   Marland Kitchen levothyroxine (SYNTHROID, LEVOTHROID) 75 MCG tablet Take 75 mcg by mouth daily.   . Melatonin-Pyridoxine (MELATIN PO) Take 1 tablet by mouth at bedtime as needed (sleep).  Marland Kitchen VITAMIN D PO Take 2 tablets by mouth daily.  . [DISCONTINUED] losartan (COZAAR) 25 MG tablet Take 1  tablet (25 mg total) by mouth daily.  . [DISCONTINUED] metoprolol tartrate (LOPRESSOR) 25 MG tablet Take 0.5 tablets (12.5 mg total) by mouth 2 (two) times daily.     Allergies:   Penicillins   Social History   Socioeconomic History  . Marital status: Unknown    Spouse name: Not on file  . Number of children: Not on file  . Years of education: Not on file  . Highest education level: Not on file  Occupational History  . Occupation: retired but PT now Development worker, community  Social Needs  . Financial resource strain: Not on file  . Food insecurity:     Worry: Not on file    Inability: Not on file  . Transportation needs:    Medical: Not on file    Non-medical: Not on file  Tobacco Use  . Smoking status: Never Smoker  . Smokeless tobacco: Never Used  Substance and Sexual Activity  . Alcohol use: Yes    Comment: 1 glass/day  . Drug use: Never  . Sexual activity: Not on file  Lifestyle  . Physical activity:    Days per week: Not on file    Minutes per session: Not on file  . Stress: Not on file  Relationships  . Social connections:    Talks on phone: Not on file    Gets together: Not on file    Attends religious service: Not on file    Active member of club or organization: Not on file    Attends meetings of clubs or organizations: Not on file    Relationship status: Not on file  Other Topics Concern  . Not on file  Social History Narrative  . Not on file     Family History: The patient's family history includes CAD in her father; Hypertension in her sister.  ROS:   Please see the history of present illness.   Denies any fevers chills nausea vomiting syncope bleeding.  All other systems reviewed and are negative.  EKGs/Labs/Other Studies Reviewed:    The following studies were reviewed today:   EKG:  EKG is not ordered today.  EKG from hospitalization reviewed.  Normal sinus rhythm.  No ischemic changes (small nonspecific V2 change T wave).  Recent Labs: 10/31/2018: ALT 20; BUN 15; Creatinine, Ser 1.01; Hemoglobin 13.3; Platelets 256; Potassium 3.8; Sodium 140 11/01/2018: TSH 3.852  Recent Lipid Panel    Component Value Date/Time   CHOL 166 11/01/2018 0337   TRIG 151 (H) 11/01/2018 0337   HDL 42 11/01/2018 0337   CHOLHDL 4.0 11/01/2018 0337   VLDL 30 11/01/2018 0337   LDLCALC 94 11/01/2018 0337    Physical Exam:    VS:  BP (!) 144/80   Pulse 70   Ht 5\' 3"  (1.6 m)   Wt 138 lb 12.8 oz (63 kg)   SpO2 99%   BMI 24.59 kg/m     Wt Readings from Last 3 Encounters:  11/27/18 138 lb 12.8 oz (63 kg)    10/31/18 135 lb (61.2 kg)     GEN:  Well nourished, well developed in no acute distress HEENT: Normal NECK: No JVD; No carotid bruits LYMPHATICS: No lymphadenopathy CARDIAC: RRR, no murmurs, rubs, gallops RESPIRATORY:  Clear to auscultation without rales, wheezing or rhonchi  ABDOMEN: Soft, non-tender, non-distended MUSCULOSKELETAL:  No edema; No deformity  SKIN: Warm and dry NEUROLOGIC:  Alert and oriented x 3 PSYCHIATRIC:  Normal affect   ASSESSMENT:    1. Chest pain, unspecified  type    PLAN:    In order of problems listed above:  Left arm pain secondary to shingles -Status post Valtrex.  Essential hypertension - Blood pressure reports reviewed.  Still elevated.  I will stop her metoprolol 12.5 twice a day and increase her losartan to 50 mg once a day and HCTZ will be added 12.5 mg once a day.  Dr. Drema Dallas will continue to monitor.  There is room to increase both of these medications.  As a third agent, one could consider amlodipine.  Family history of CAD - Despite the culprit for her left arm pain, shingles, I did recommend that she consider a coronary calcium score.  If she has severe dense atherosclerosis, coronary calcium score greater than 400, we would proceed with nuclear stress testing.  If calcium score is 0 or moderate, we would continue with atorvastatin, perhaps increasing the dose if there is evidence of atherosclerosis.  Her husband was worried about her.  Obviously, if symptoms occur, she will let me know.   Medication Adjustments/Labs and Tests Ordered: Current medicines are reviewed at length with the patient today.  Concerns regarding medicines are outlined above.  Orders Placed This Encounter  Procedures  . CT CARDIAC SCORING   Meds ordered this encounter  Medications  . losartan (COZAAR) 50 MG tablet    Sig: Take 1 tablet (50 mg total) by mouth daily.    Dispense:  90 tablet    Refill:  3  . hydrochlorothiazide (HYDRODIURIL) 25 MG tablet     Sig: Take 0.5 tablets (12.5 mg total) by mouth daily.    Dispense:  45 tablet    Refill:  3    Patient Instructions  Medication Instructions:  Please discontinue your Metoprolol. Increase your Losartan to 50 mg a day. Start Hydrochlorothiazide 25 mg 1/2 tablet daily. Continue all other medications as listed.  You are being scheduled for a Coronary Calcium score.  The testing is completed here at our office.  The cost is $150 due at the time of the CT scan.  Follow-Up: Follow up as needed with Dr Marlou Porch.  Thank you for choosing [facility]!!          Signed, Candee Furbish, MD  11/27/2018 9:24 AM    [place]

## 2018-11-27 NOTE — Patient Instructions (Addendum)
Medication Instructions:  Please discontinue your Metoprolol. Increase your Losartan to 50 mg a day. Start Hydrochlorothiazide 25 mg 1/2 tablet daily. Continue all other medications as listed.  You are being scheduled for a Coronary Calcium score.  The testing is completed here at our office.  The cost is $150 due at the time of the CT scan.  Follow-Up: Follow up as needed with Dr Marlou Porch.  Thank you for choosing [facility]!!

## 2018-12-14 ENCOUNTER — Ambulatory Visit (INDEPENDENT_AMBULATORY_CARE_PROVIDER_SITE_OTHER)
Admission: RE | Admit: 2018-12-14 | Discharge: 2018-12-14 | Disposition: A | Discharge status: Home / Self Care | Payer: Self-pay | Source: Ambulatory Visit | Attending: Cardiology | Admitting: Cardiology

## 2018-12-14 DIAGNOSIS — R079 Chest pain, unspecified: Secondary | ICD-10-CM

## 2019-09-09 ENCOUNTER — Other Ambulatory Visit: Payer: Self-pay | Admitting: Cardiology

## 2020-01-08 ENCOUNTER — Ambulatory Visit: Payer: Medicare Other | Attending: Internal Medicine

## 2020-01-08 DIAGNOSIS — Z23 Encounter for immunization: Secondary | ICD-10-CM | POA: Insufficient documentation

## 2020-01-08 NOTE — Progress Notes (Signed)
   Covid-19 Vaccination Clinic  Name:  Yolanda Webb    MRN: [id number] DOB: 1946-06-15  01/08/2020  [name] was observed post Covid-19 immunization for 15 minutes without incidence. She was provided with Vaccine Information Sheet and instruction to access the V-Safe system.   [name] was instructed to call 911 with any severe reactions post vaccine: Marland Kitchen Difficulty breathing  . Swelling of your face and throat  . A fast heartbeat  . A bad rash all over your body  . Dizziness and weakness    Immunizations Administered    Name Date Dose VIS Date Route   Pfizer COVID-19 Vaccine 01/08/2020  9:02 AM 0.3 mL 11/30/2019 Intramuscular   Manufacturer: [employer]   Lot: [id number]   [place]: [id number]

## 2020-01-26 ENCOUNTER — Ambulatory Visit: Payer: Medicare Other | Attending: Internal Medicine

## 2020-01-26 DIAGNOSIS — Z23 Encounter for immunization: Secondary | ICD-10-CM

## 2020-01-26 NOTE — Progress Notes (Signed)
   Covid-19 Vaccination Clinic  Name:  Yolanda Webb    MRN: [id number] DOB: 1946/04/29  01/26/2020  [name] was observed post Covid-19 immunization for 15 minutes without incidence. She was provided with Vaccine Information Sheet and instruction to access the V-Safe system.   [name] was instructed to call 911 with any severe reactions post vaccine: Marland Kitchen Difficulty breathing  . Swelling of your face and throat  . A fast heartbeat  . A bad rash all over your body  . Dizziness and weakness    Immunizations Administered    Name Date Dose VIS Date Route   Pfizer COVID-19 Vaccine 01/26/2020 12:54 PM 0.3 mL 11/30/2019 Intramuscular   Manufacturer: [place]   Lot: [id number]   [place]: [id number]

## 2020-04-26 IMAGING — DX DG CHEST 2V
2 series · 2 of 2 positions shown · non-contrast
Comparison: None.

CLINICAL DATA: Hypertension and left arm pain.

EXAM:
CHEST - 2 VIEW

[w chest pa]
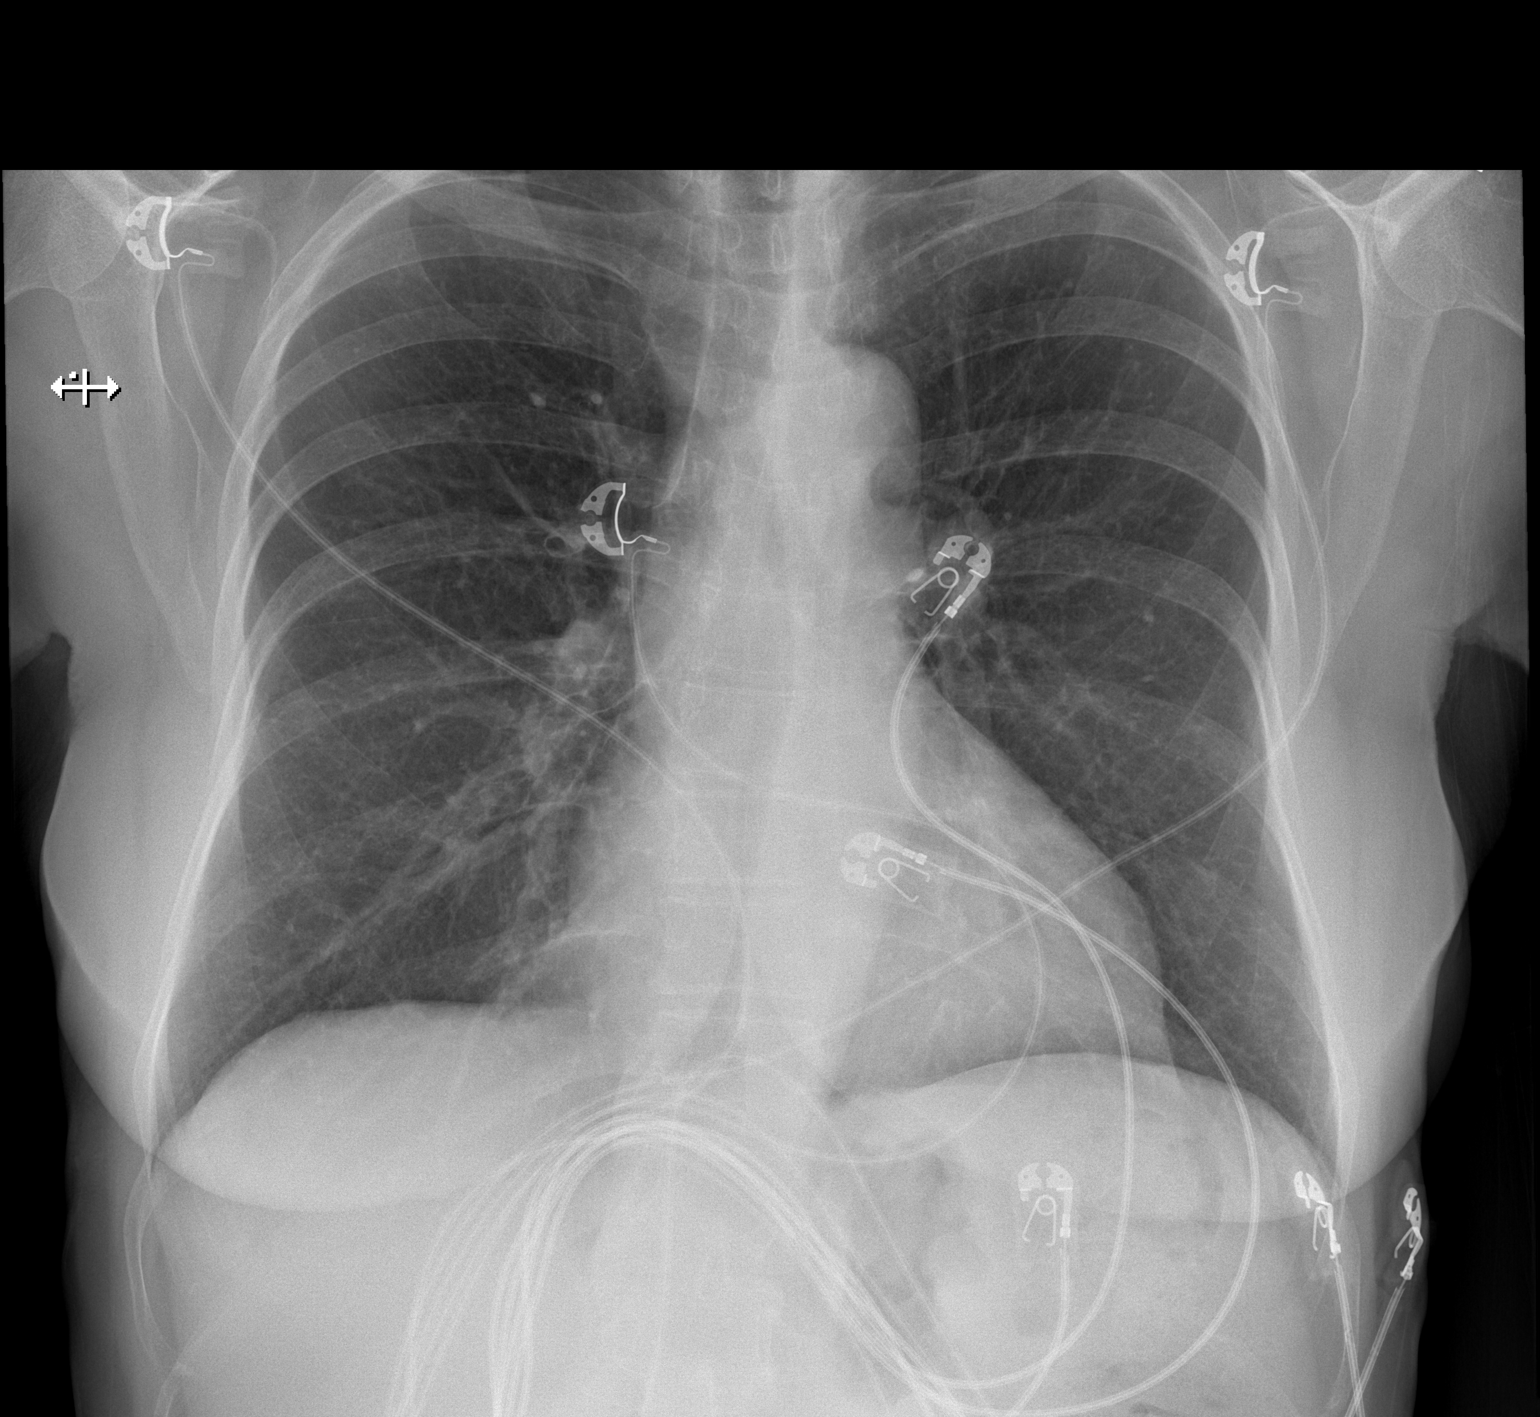

[w chest lat]
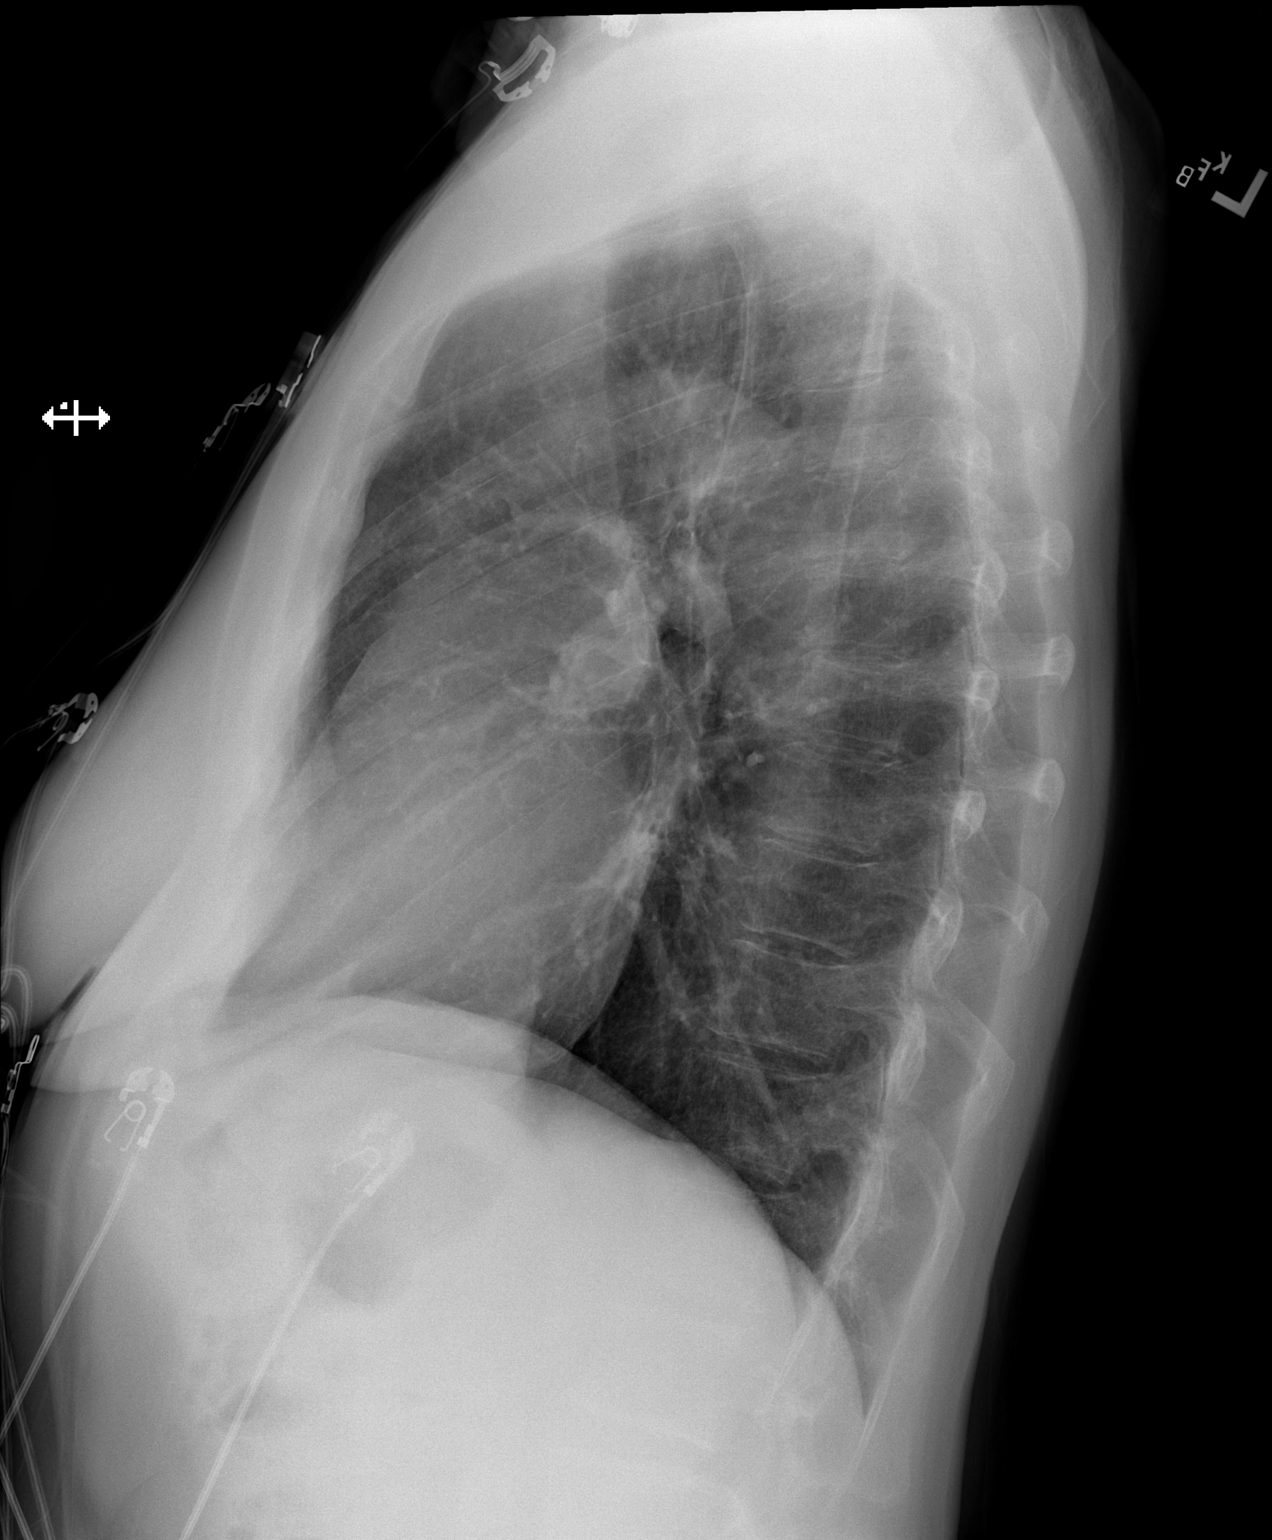

[2 of 2 positions shown; findings below may reference images not displayed]

FINDINGS: The heart size and mediastinal contours are within normal limits.
Both lungs are clear. The visualized skeletal structures are
unremarkable.
IMPRESSION: No active cardiopulmonary disease.

## 2020-06-09 IMAGING — CT CT HEART SCORING
2 series · 16 of 20 positions shown, 18 images · non-contrast
Comparison: None.

Addendum:
EXAM:
OVER-READ INTERPRETATION  CT CHEST

The following report is an over-read performed by radiologist Dr.
over-read does not include interpretation of cardiac or coronary
anatomy or pathology. The calcium score interpretation by the
cardiologist is attached.
CLINICAL DATA: Risk stratification
Coronary Calcium Score
TECHNIQUE: The patient was scanned on a Siemens Somatom 64 slice scanner. Axial
non-contrast 3 mm slices were carried out through the heart. The
data set was analyzed on a dedicated work station and scored using
the Agatson method.

[Series 3: casc 3.0 i36f 2 bestdiast 69 % · axial · 0.34mm/px · z∈[+1201,+1297]mm · 8 of 42 slices shown, 10 images]
[im 5/42  vessel]
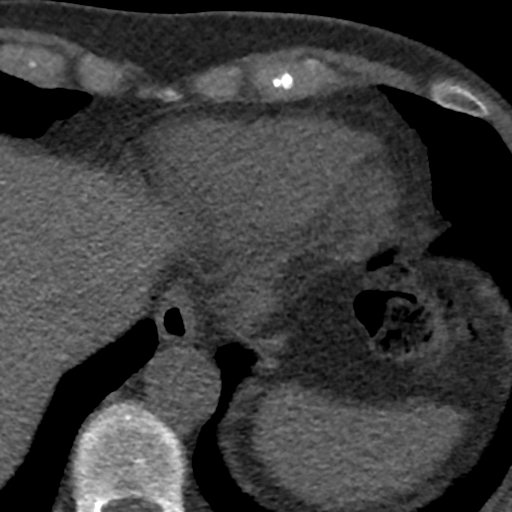
[im 5/42  lung]
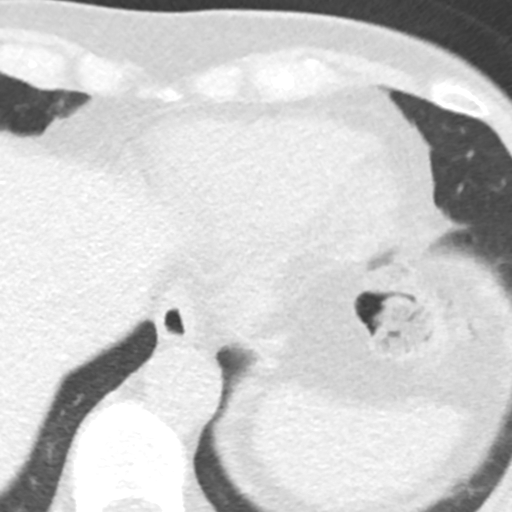
[im 10/42  vessel]
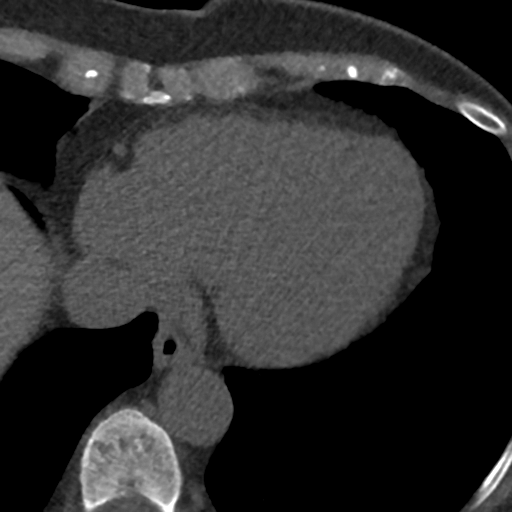
[im 14/42  vessel]
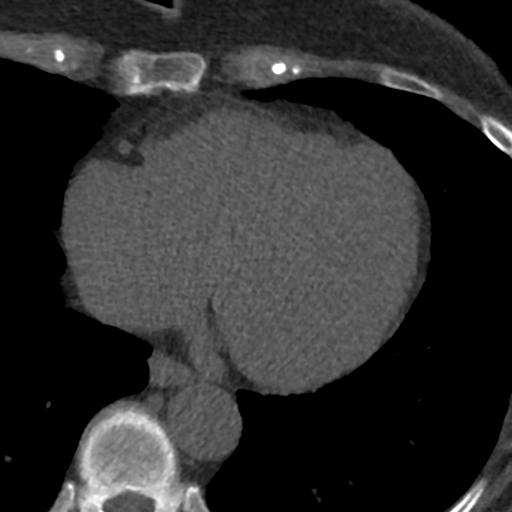
[im 19/42  vessel]
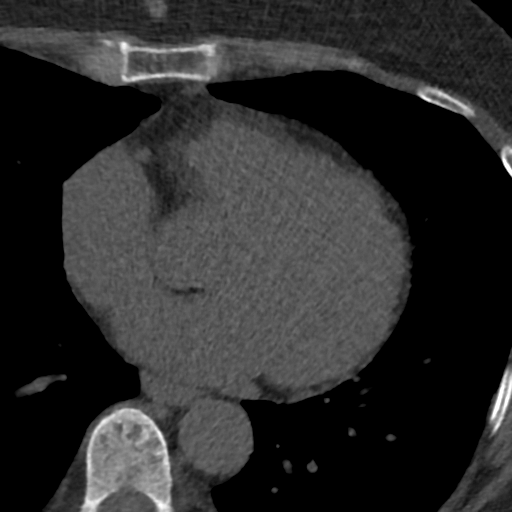
[im 23/42  vessel]
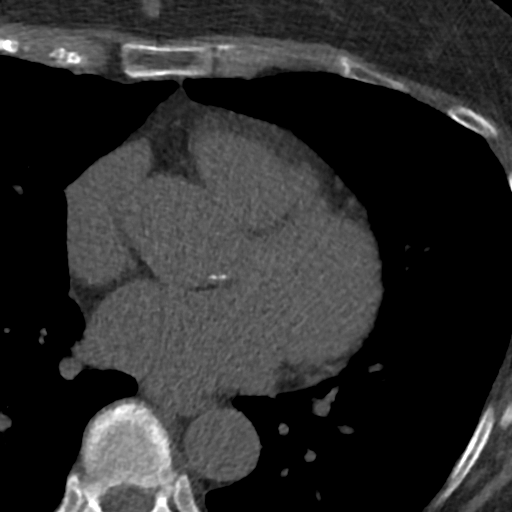
[im 23/42  lung]
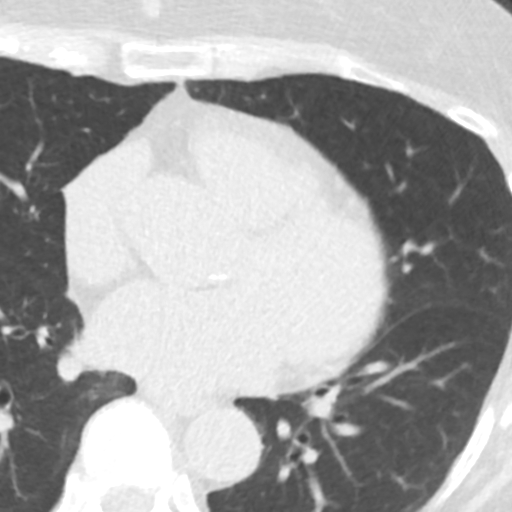
[im 28/42  vessel]
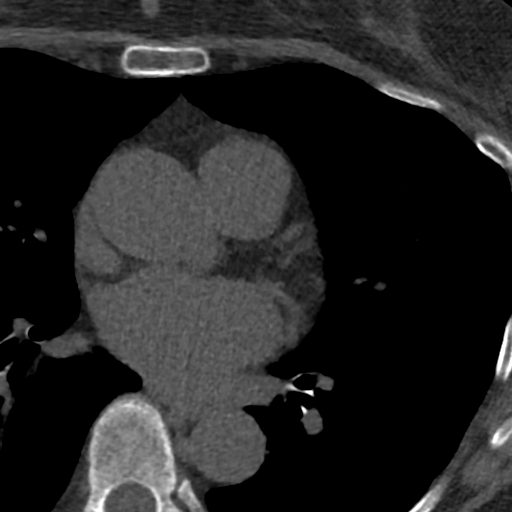
[im 32/42  vessel]
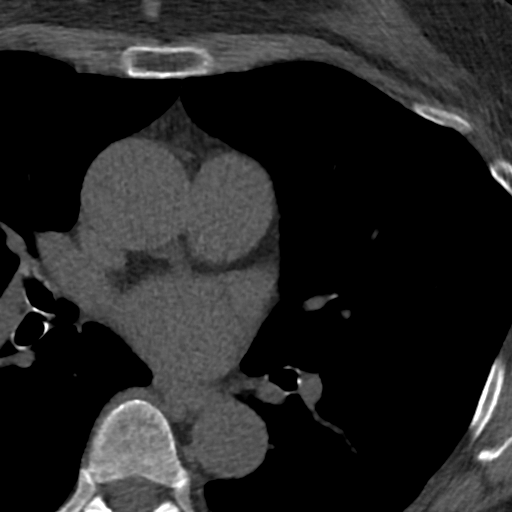
[im 37/42  vessel]
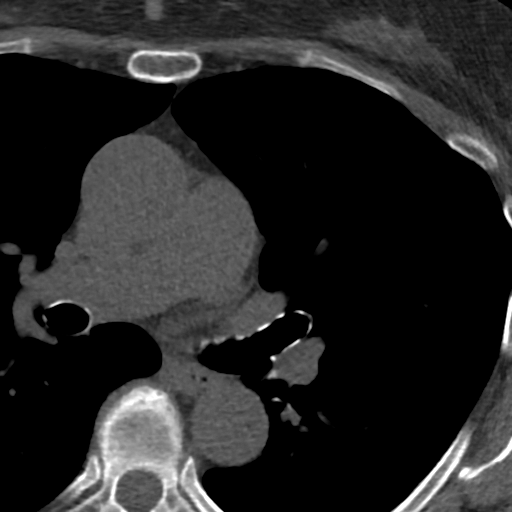

[Series 5: lung st 70 % · axial · 0.55mm/px · z∈[+1201,+1297]mm · 8 of 42 slices shown]
[im 5/42  lung]
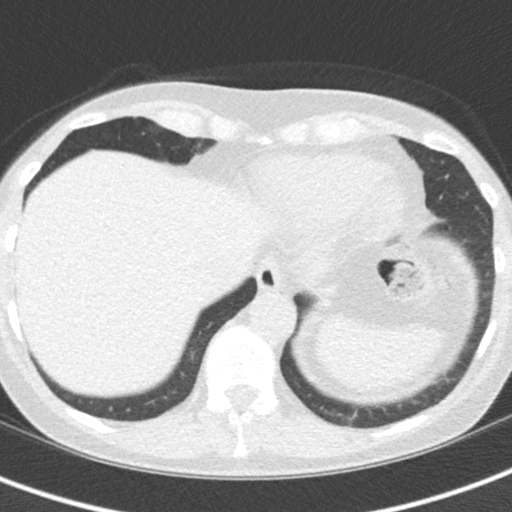
[im 10/42  lung]
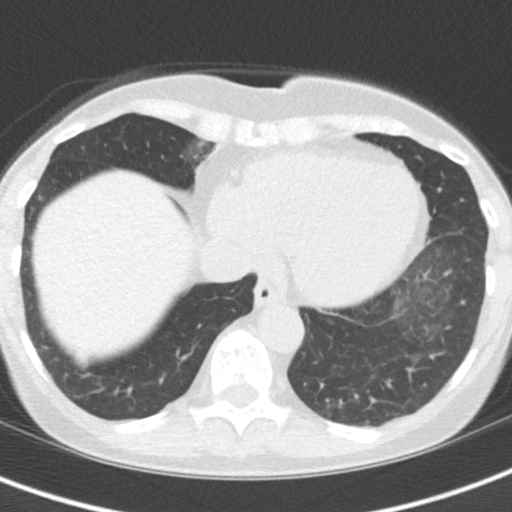
[im 14/42  lung]
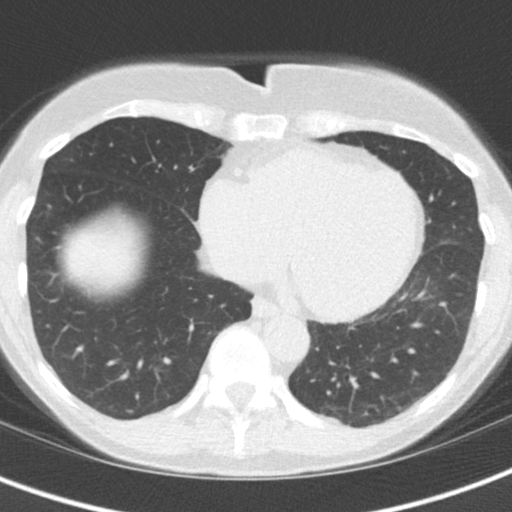
[im 19/42  lung]
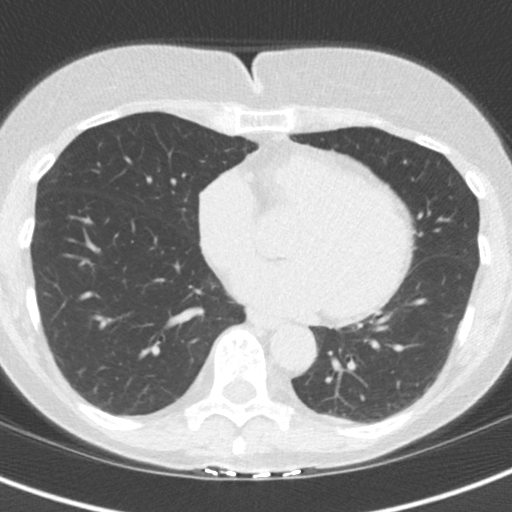
[im 23/42  lung]
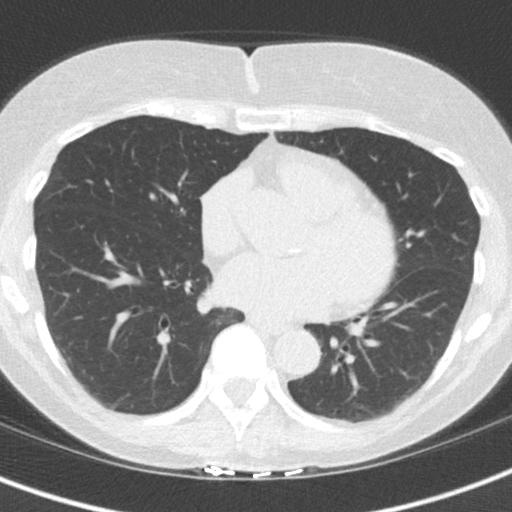
[im 28/42  lung]
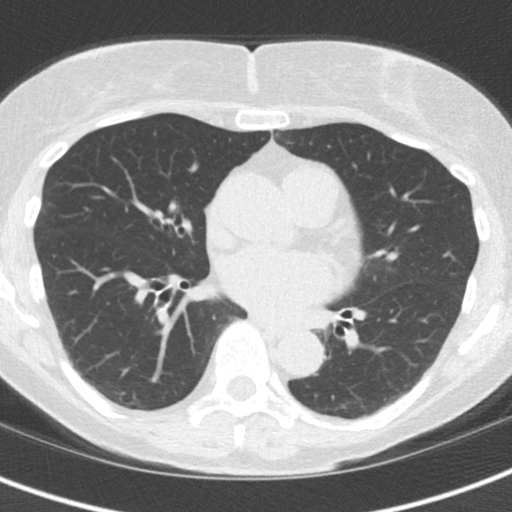
[im 32/42  lung]
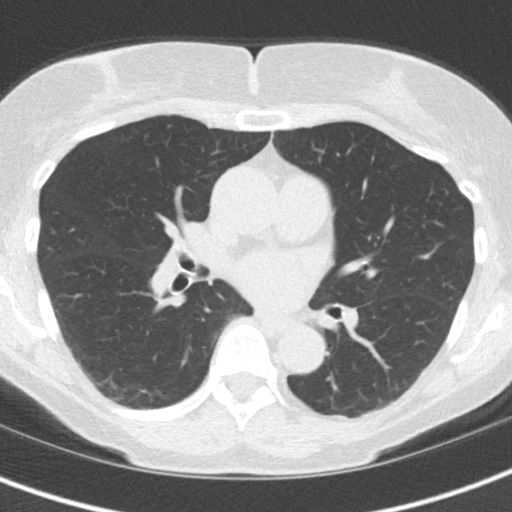
[im 37/42  lung]
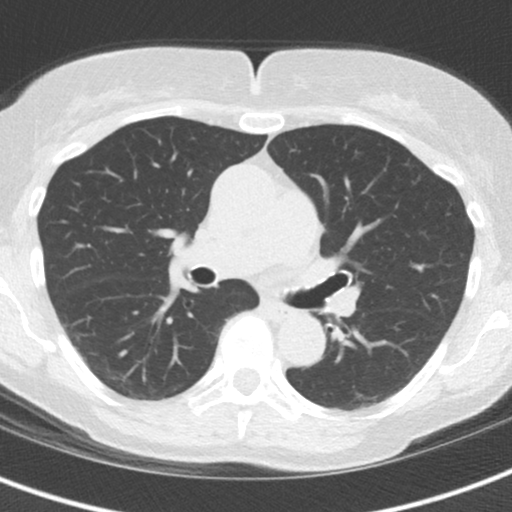

[16 of 20 positions shown; findings below may reference images not displayed]

FINDINGS: Vascular: Aortic atherosclerosis.

Mediastinum/Nodes: No imaged thoracic adenopathy.

Lungs/Pleura: Focal left pleural thickening including on image [DATE].
No pleural fluid. Clear imaged portions of the lungs.

Upper Abdomen: Normal imaged portions of the liver, spleen, stomach.

Musculoskeletal: Mild thoracic spondylosis.
IMPRESSION: No acute findings in the imaged extracardiac chest. Aortic
Atherosclerosis ([id number]-ALX.X).
FINDINGS: Non-cardiac: See separate report from [REDACTED].

Ascending aorta: Normal diameter 3.7 cm

Pericardium: Normal

Coronary arteries: No calcium noted
IMPRESSION: Coronary calcium score of 0.

Jean Robensy Dassas

*** End of Addendum ***

## 2021-01-23 DIAGNOSIS — E559 Vitamin D deficiency, unspecified: Secondary | ICD-10-CM | POA: Diagnosis not present

## 2021-01-23 DIAGNOSIS — M810 Age-related osteoporosis without current pathological fracture: Secondary | ICD-10-CM | POA: Diagnosis not present

## 2021-02-19 DIAGNOSIS — E7800 Pure hypercholesterolemia, unspecified: Secondary | ICD-10-CM | POA: Diagnosis not present

## 2021-08-05 DIAGNOSIS — C44319 Basal cell carcinoma of skin of other parts of face: Secondary | ICD-10-CM | POA: Diagnosis not present

## 2021-09-09 DIAGNOSIS — Z85828 Personal history of other malignant neoplasm of skin: Secondary | ICD-10-CM | POA: Diagnosis not present

## 2021-09-09 DIAGNOSIS — Z08 Encounter for follow-up examination after completed treatment for malignant neoplasm: Secondary | ICD-10-CM | POA: Diagnosis not present

## 2021-11-25 DIAGNOSIS — M810 Age-related osteoporosis without current pathological fracture: Secondary | ICD-10-CM | POA: Diagnosis not present

## 2021-11-25 DIAGNOSIS — E7800 Pure hypercholesterolemia, unspecified: Secondary | ICD-10-CM | POA: Diagnosis not present

## 2021-11-25 DIAGNOSIS — E039 Hypothyroidism, unspecified: Secondary | ICD-10-CM | POA: Diagnosis not present

## 2021-11-25 DIAGNOSIS — R7303 Prediabetes: Secondary | ICD-10-CM | POA: Diagnosis not present

## 2021-11-25 DIAGNOSIS — I10 Essential (primary) hypertension: Secondary | ICD-10-CM | POA: Diagnosis not present

## 2021-11-25 DIAGNOSIS — Z0000 Encounter for general adult medical examination without abnormal findings: Secondary | ICD-10-CM | POA: Diagnosis not present

## 2021-11-27 DIAGNOSIS — R059 Cough, unspecified: Secondary | ICD-10-CM | POA: Diagnosis not present

## 2021-11-27 DIAGNOSIS — R509 Fever, unspecified: Secondary | ICD-10-CM | POA: Diagnosis not present

## 2021-11-27 DIAGNOSIS — R5383 Other fatigue: Secondary | ICD-10-CM | POA: Diagnosis not present

## 2021-11-27 DIAGNOSIS — N390 Urinary tract infection, site not specified: Secondary | ICD-10-CM | POA: Diagnosis not present

## 2022-02-04 DIAGNOSIS — Z1231 Encounter for screening mammogram for malignant neoplasm of breast: Secondary | ICD-10-CM | POA: Diagnosis not present

## 2022-10-03 DIAGNOSIS — H6122 Impacted cerumen, left ear: Secondary | ICD-10-CM | POA: Diagnosis not present

## 2022-10-15 DIAGNOSIS — R221 Localized swelling, mass and lump, neck: Secondary | ICD-10-CM | POA: Diagnosis not present

## 2022-11-01 ENCOUNTER — Other Ambulatory Visit: Payer: Self-pay

## 2022-11-01 ENCOUNTER — Encounter (HOSPITAL_COMMUNITY): Payer: Self-pay

## 2022-11-01 ENCOUNTER — Emergency Department (HOSPITAL_COMMUNITY): Payer: Medicare Other

## 2022-11-01 ENCOUNTER — Emergency Department (HOSPITAL_COMMUNITY)
Admission: EM | Admit: 2022-11-01 | Discharge: 2022-11-01 | Discharge status: Against Medical Advice | Payer: Medicare Other | Attending: Emergency Medicine | Admitting: Emergency Medicine

## 2022-11-01 DIAGNOSIS — Z5321 Procedure and treatment not carried out due to patient leaving prior to being seen by health care provider: Secondary | ICD-10-CM | POA: Insufficient documentation

## 2022-11-01 DIAGNOSIS — K760 Fatty (change of) liver, not elsewhere classified: Secondary | ICD-10-CM | POA: Diagnosis not present

## 2022-11-01 DIAGNOSIS — K5900 Constipation, unspecified: Secondary | ICD-10-CM | POA: Diagnosis not present

## 2022-11-01 DIAGNOSIS — R1032 Left lower quadrant pain: Secondary | ICD-10-CM | POA: Diagnosis not present

## 2022-11-01 LAB — CBC WITH DIFFERENTIAL/PLATELET
Abs Immature Granulocytes: 0.15 10*3/uL — ABNORMAL HIGH (ref 0.00–0.07)
Basophils Absolute: 0.1 10*3/uL (ref 0.0–0.1)
Basophils Relative: 0 %
Eosinophils Absolute: 0 10*3/uL (ref 0.0–0.5)
Eosinophils Relative: 0 %
HCT: 41.6 % (ref 36.0–46.0)
Hemoglobin: 14.2 g/dL (ref 12.0–15.0)
Immature Granulocytes: 1 %
Lymphocytes Relative: 5 %
Lymphs Abs: 1 10*3/uL (ref 0.7–4.0)
MCH: 30.3 pg (ref 26.0–34.0)
MCHC: 34.1 g/dL (ref 30.0–36.0)
MCV: 88.9 fL (ref 80.0–100.0)
Monocytes Absolute: 1.9 10*3/uL — ABNORMAL HIGH (ref 0.1–1.0)
Monocytes Relative: 8 %
Neutro Abs: 19.4 10*3/uL — ABNORMAL HIGH (ref 1.7–7.7)
Neutrophils Relative %: 86 %
Platelets: 408 10*3/uL — ABNORMAL HIGH (ref 150–400)
RBC: 4.68 MIL/uL (ref 3.87–5.11)
RDW: 12 % (ref 11.5–15.5)
WBC: 22.5 10*3/uL — ABNORMAL HIGH (ref 4.0–10.5)
nRBC: 0 % (ref 0.0–0.2)

## 2022-11-01 LAB — COMPREHENSIVE METABOLIC PANEL
ALT: 14 U/L (ref 0–44)
AST: 21 U/L (ref 15–41)
Albumin: 4.1 g/dL (ref 3.5–5.0)
Alkaline Phosphatase: 60 U/L (ref 38–126)
Anion gap: 13 (ref 5–15)
BUN: 22 mg/dL (ref 8–23)
CO2: 26 mmol/L (ref 22–32)
Calcium: 9.9 mg/dL (ref 8.9–10.3)
Chloride: 95 mmol/L — ABNORMAL LOW (ref 98–111)
Creatinine, Ser: 1.1 mg/dL — ABNORMAL HIGH (ref 0.44–1.00)
GFR, Estimated: 53 mL/min — ABNORMAL LOW (ref >60)
Glucose, Bld: 175 mg/dL — ABNORMAL HIGH (ref 70–99)
Potassium: 3.7 mmol/L (ref 3.5–5.1)
Sodium: 134 mmol/L — ABNORMAL LOW (ref 135–145)
Total Bilirubin: 1 mg/dL (ref 0.3–1.2)
Total Protein: 7.6 g/dL (ref 6.5–8.1)

## 2022-11-01 LAB — URINALYSIS, ROUTINE W REFLEX MICROSCOPIC
Bilirubin Urine: NEGATIVE
Glucose, UA: NEGATIVE mg/dL
Hgb urine dipstick: NEGATIVE
Ketones, ur: 5 mg/dL — AB
Leukocytes,Ua: NEGATIVE
Nitrite: NEGATIVE
Protein, ur: NEGATIVE mg/dL
Specific Gravity, Urine: 1.024 (ref 1.005–1.030)
pH: 5 (ref 5.0–8.0)

## 2022-11-01 LAB — LIPASE, BLOOD: Lipase: 43 U/L (ref 11–51)

## 2022-11-01 LAB — TYPE AND SCREEN
ABO/RH(D): O POS
Antibody Screen: NEGATIVE

## 2022-11-01 MED ORDER — SODIUM CHLORIDE (PF) 0.9 % IJ SOLN
INTRAMUSCULAR | Status: AC
  Filled 2022-11-01: qty 50

## 2022-11-01 MED ORDER — IOHEXOL 300 MG/ML  SOLN
100.0000 mL | Freq: Once | INTRAMUSCULAR | Status: AC | PRN
  Administered 2022-11-01: 100 mL via INTRAVENOUS

## 2022-11-01 NOTE — ED Provider Triage Note (Signed)
Emergency Medicine Provider Triage Evaluation Note  Rakeya Glab , a 76 y.o. female  was evaluated in triage.  Pt complains of constipation and abdominal pain. The patient reprots that she has been constipated for the past 5-6 days. She tried multiple stool softners with no result. Last night, she tried exlax and had black watery stool, diffuse abdominal pain (but mainly in the LLQ), nausea, and non-bilious non bloody emesis. Reports low grade fevers. She had formed stools this AM. Reports she was up all night last night with BM.   Review of Systems  Positive:  Negative:   Physical Exam  BP 127/72   Pulse 96   Temp 98.4 F (36.9 C) (Oral)   Resp 17   Ht '5\' 3"'$  (1.6 m)   Wt 58.1 kg   SpO2 99%   BMI 22.67 kg/m  Gen:   Awake, no distress   Resp:  Normal effort  MSK:   Moves extremities without difficulty  Other:  LLQ tenderness. Soft.   Medical Decision Making  Medically screening exam initiated at 9:36 AM.  Appropriate orders placed.  Kellee Sittner was informed that the remainder of the evaluation will be completed by another provider, this initial triage assessment does not replace that evaluation, and the importance of remaining in the ED until their evaluation is complete.  CT abd and labs ordered.    Sherrell Puller, [place] 11/01/22 [phone]

## 2022-11-01 NOTE — ED Triage Notes (Signed)
Patient reports that she had constipation x 6 days. Patient states she had used a stool softener with no results. Patient states she then took an Ex-lax and had a lot of BM's that were black in color. Patient also reports vomiting that started yesterday as well. Patient also c/o LLQ abdominal pain and low grade fever.

## 2022-11-02 DIAGNOSIS — K529 Noninfective gastroenteritis and colitis, unspecified: Secondary | ICD-10-CM | POA: Diagnosis not present

## 2022-11-02 DIAGNOSIS — K5900 Constipation, unspecified: Secondary | ICD-10-CM | POA: Diagnosis not present

## 2022-11-05 ENCOUNTER — Telehealth: Payer: Self-pay

## 2022-11-05 NOTE — Telephone Encounter (Signed)
     Patient  visit on 11/13  at [facility]   Have you been able to follow up with your primary care physician? Yes   The patient was or was not able to obtain any needed medicine or equipment. Yes   Are there diet recommendations that you are having difficulty following? Na   Patient expresses understanding of discharge instructions and education provided has no other needs at this time. Yes     [place], [facility], Care Management  [phone], [place], [place] [address] Phone: [phone] Email: [email or web address]

## 2022-11-08 DIAGNOSIS — R935 Abnormal findings on diagnostic imaging of other abdominal regions, including retroperitoneum: Secondary | ICD-10-CM | POA: Diagnosis not present

## 2022-11-08 DIAGNOSIS — Z1211 Encounter for screening for malignant neoplasm of colon: Secondary | ICD-10-CM | POA: Diagnosis not present

## 2022-11-08 DIAGNOSIS — K529 Noninfective gastroenteritis and colitis, unspecified: Secondary | ICD-10-CM | POA: Diagnosis not present

## 2022-11-08 DIAGNOSIS — K5792 Diverticulitis of intestine, part unspecified, without perforation or abscess without bleeding: Secondary | ICD-10-CM | POA: Diagnosis not present

## 2022-11-25 DIAGNOSIS — Z1211 Encounter for screening for malignant neoplasm of colon: Secondary | ICD-10-CM | POA: Diagnosis not present

## 2022-11-25 DIAGNOSIS — R935 Abnormal findings on diagnostic imaging of other abdominal regions, including retroperitoneum: Secondary | ICD-10-CM | POA: Diagnosis not present

## 2022-11-25 DIAGNOSIS — K5792 Diverticulitis of intestine, part unspecified, without perforation or abscess without bleeding: Secondary | ICD-10-CM | POA: Diagnosis not present

## 2022-11-30 DIAGNOSIS — Z0000 Encounter for general adult medical examination without abnormal findings: Secondary | ICD-10-CM | POA: Diagnosis not present

## 2022-11-30 DIAGNOSIS — K529 Noninfective gastroenteritis and colitis, unspecified: Secondary | ICD-10-CM | POA: Diagnosis not present

## 2022-11-30 DIAGNOSIS — E7800 Pure hypercholesterolemia, unspecified: Secondary | ICD-10-CM | POA: Diagnosis not present

## 2022-11-30 DIAGNOSIS — M810 Age-related osteoporosis without current pathological fracture: Secondary | ICD-10-CM | POA: Diagnosis not present

## 2022-11-30 DIAGNOSIS — I10 Essential (primary) hypertension: Secondary | ICD-10-CM | POA: Diagnosis not present

## 2022-11-30 DIAGNOSIS — E039 Hypothyroidism, unspecified: Secondary | ICD-10-CM | POA: Diagnosis not present

## 2022-12-06 DIAGNOSIS — I10 Essential (primary) hypertension: Secondary | ICD-10-CM | POA: Diagnosis not present

## 2022-12-06 DIAGNOSIS — Z6821 Body mass index (BMI) 21.0-21.9, adult: Secondary | ICD-10-CM | POA: Diagnosis not present

## 2022-12-06 DIAGNOSIS — R7303 Prediabetes: Secondary | ICD-10-CM | POA: Diagnosis not present

## 2022-12-06 DIAGNOSIS — E039 Hypothyroidism, unspecified: Secondary | ICD-10-CM | POA: Diagnosis not present

## 2022-12-06 DIAGNOSIS — E7800 Pure hypercholesterolemia, unspecified: Secondary | ICD-10-CM | POA: Diagnosis not present

## 2022-12-06 DIAGNOSIS — E559 Vitamin D deficiency, unspecified: Secondary | ICD-10-CM | POA: Diagnosis not present

## 2022-12-06 DIAGNOSIS — M810 Age-related osteoporosis without current pathological fracture: Secondary | ICD-10-CM | POA: Diagnosis not present

## 2023-01-26 DIAGNOSIS — D123 Benign neoplasm of transverse colon: Secondary | ICD-10-CM | POA: Diagnosis not present

## 2023-01-26 DIAGNOSIS — K635 Polyp of colon: Secondary | ICD-10-CM | POA: Diagnosis not present

## 2023-01-26 DIAGNOSIS — D128 Benign neoplasm of rectum: Secondary | ICD-10-CM | POA: Diagnosis not present

## 2023-01-26 DIAGNOSIS — K649 Unspecified hemorrhoids: Secondary | ICD-10-CM | POA: Diagnosis not present

## 2023-01-26 DIAGNOSIS — R933 Abnormal findings on diagnostic imaging of other parts of digestive tract: Secondary | ICD-10-CM | POA: Diagnosis not present

## 2023-01-26 DIAGNOSIS — K5730 Diverticulosis of large intestine without perforation or abscess without bleeding: Secondary | ICD-10-CM | POA: Diagnosis not present

## 2023-01-28 DIAGNOSIS — D128 Benign neoplasm of rectum: Secondary | ICD-10-CM | POA: Diagnosis not present

## 2023-01-28 DIAGNOSIS — K635 Polyp of colon: Secondary | ICD-10-CM | POA: Diagnosis not present

## 2023-01-28 DIAGNOSIS — D123 Benign neoplasm of transverse colon: Secondary | ICD-10-CM | POA: Diagnosis not present

## 2023-02-10 DIAGNOSIS — Z780 Asymptomatic menopausal state: Secondary | ICD-10-CM | POA: Diagnosis not present

## 2023-02-10 DIAGNOSIS — Z1231 Encounter for screening mammogram for malignant neoplasm of breast: Secondary | ICD-10-CM | POA: Diagnosis not present

## 2023-02-10 DIAGNOSIS — M8589 Other specified disorders of bone density and structure, multiple sites: Secondary | ICD-10-CM | POA: Diagnosis not present

## 2023-03-10 DIAGNOSIS — E559 Vitamin D deficiency, unspecified: Secondary | ICD-10-CM | POA: Diagnosis not present

## 2023-03-10 DIAGNOSIS — E039 Hypothyroidism, unspecified: Secondary | ICD-10-CM | POA: Diagnosis not present

## 2023-05-30 DIAGNOSIS — L309 Dermatitis, unspecified: Secondary | ICD-10-CM | POA: Diagnosis not present

## 2023-12-01 DIAGNOSIS — Z0000 Encounter for general adult medical examination without abnormal findings: Secondary | ICD-10-CM | POA: Diagnosis not present

## 2023-12-07 DIAGNOSIS — R7303 Prediabetes: Secondary | ICD-10-CM | POA: Diagnosis not present

## 2023-12-07 DIAGNOSIS — E039 Hypothyroidism, unspecified: Secondary | ICD-10-CM | POA: Diagnosis not present

## 2023-12-07 DIAGNOSIS — Z1159 Encounter for screening for other viral diseases: Secondary | ICD-10-CM | POA: Diagnosis not present

## 2023-12-07 DIAGNOSIS — E7800 Pure hypercholesterolemia, unspecified: Secondary | ICD-10-CM | POA: Diagnosis not present

## 2023-12-07 DIAGNOSIS — I10 Essential (primary) hypertension: Secondary | ICD-10-CM | POA: Diagnosis not present

## 2023-12-07 DIAGNOSIS — M810 Age-related osteoporosis without current pathological fracture: Secondary | ICD-10-CM | POA: Diagnosis not present

## 2023-12-07 DIAGNOSIS — Z6822 Body mass index (BMI) 22.0-22.9, adult: Secondary | ICD-10-CM | POA: Diagnosis not present

## 2024-02-16 DIAGNOSIS — Z1231 Encounter for screening mammogram for malignant neoplasm of breast: Secondary | ICD-10-CM | POA: Diagnosis not present

## 2024-03-13 DIAGNOSIS — E039 Hypothyroidism, unspecified: Secondary | ICD-10-CM | POA: Diagnosis not present

## 2024-03-28 DIAGNOSIS — B078 Other viral warts: Secondary | ICD-10-CM | POA: Diagnosis not present

## 2024-12-09 ENCOUNTER — Encounter (HOSPITAL_COMMUNITY): Payer: Self-pay | Admitting: Emergency Medicine

## 2024-12-09 ENCOUNTER — Inpatient Hospital Stay (HOSPITAL_COMMUNITY)
Admission: EM | Admit: 2024-12-09 | Discharge: 2024-12-12 | DRG: 392 | Disposition: A | Discharge status: Home / Self Care | Attending: Internal Medicine | Admitting: Internal Medicine

## 2024-12-09 ENCOUNTER — Other Ambulatory Visit: Payer: Self-pay

## 2024-12-09 ENCOUNTER — Emergency Department (HOSPITAL_COMMUNITY)

## 2024-12-09 DIAGNOSIS — D649 Anemia, unspecified: Secondary | ICD-10-CM | POA: Diagnosis present

## 2024-12-09 DIAGNOSIS — E872 Acidosis, unspecified: Secondary | ICD-10-CM | POA: Diagnosis present

## 2024-12-09 DIAGNOSIS — E861 Hypovolemia: Secondary | ICD-10-CM | POA: Diagnosis present

## 2024-12-09 DIAGNOSIS — E785 Hyperlipidemia, unspecified: Secondary | ICD-10-CM | POA: Diagnosis present

## 2024-12-09 DIAGNOSIS — I10 Essential (primary) hypertension: Secondary | ICD-10-CM | POA: Diagnosis present

## 2024-12-09 DIAGNOSIS — K5900 Constipation, unspecified: Secondary | ICD-10-CM | POA: Diagnosis present

## 2024-12-09 DIAGNOSIS — Z880 Allergy status to penicillin: Secondary | ICD-10-CM

## 2024-12-09 DIAGNOSIS — R112 Nausea with vomiting, unspecified: Principal | ICD-10-CM

## 2024-12-09 DIAGNOSIS — K529 Noninfective gastroenteritis and colitis, unspecified: Secondary | ICD-10-CM | POA: Diagnosis present

## 2024-12-09 DIAGNOSIS — E876 Hypokalemia: Secondary | ICD-10-CM | POA: Diagnosis present

## 2024-12-09 DIAGNOSIS — I9589 Other hypotension: Secondary | ICD-10-CM | POA: Diagnosis present

## 2024-12-09 DIAGNOSIS — E860 Dehydration: Secondary | ICD-10-CM | POA: Diagnosis present

## 2024-12-09 DIAGNOSIS — E039 Hypothyroidism, unspecified: Secondary | ICD-10-CM | POA: Diagnosis present

## 2024-12-09 DIAGNOSIS — Z6820 Body mass index (BMI) 20.0-20.9, adult: Secondary | ICD-10-CM

## 2024-12-09 DIAGNOSIS — A0811 Acute gastroenteropathy due to Norwalk agent: Principal | ICD-10-CM | POA: Diagnosis present

## 2024-12-09 DIAGNOSIS — E441 Mild protein-calorie malnutrition: Secondary | ICD-10-CM | POA: Diagnosis present

## 2024-12-09 DIAGNOSIS — Z1152 Encounter for screening for COVID-19: Secondary | ICD-10-CM

## 2024-12-09 HISTORY — DX: Hypothyroidism, unspecified: E03.9

## 2024-12-09 LAB — COMPREHENSIVE METABOLIC PANEL WITH GFR
ALT: 14 U/L (ref 0–44)
AST: 29 U/L (ref 15–41)
Albumin: 4.4 g/dL (ref 3.5–5.0)
Alkaline Phosphatase: 54 U/L (ref 38–126)
Anion gap: 14 (ref 5–15)
BUN: 23 mg/dL (ref 8–23)
CO2: 23 mmol/L (ref 22–32)
Calcium: 10.7 mg/dL — ABNORMAL HIGH (ref 8.9–10.3)
Chloride: 103 mmol/L (ref 98–111)
Creatinine, Ser: 0.96 mg/dL (ref 0.44–1.00)
GFR, Estimated: >60 mL/min
Glucose, Bld: 172 mg/dL — ABNORMAL HIGH (ref 70–99)
Potassium: 2.9 mmol/L — ABNORMAL LOW (ref 3.5–5.1)
Sodium: 139 mmol/L (ref 135–145)
Total Bilirubin: 0.8 mg/dL (ref 0.0–1.2)
Total Protein: 6.8 g/dL (ref 6.5–8.1)

## 2024-12-09 LAB — LIPASE, BLOOD: Lipase: 39 U/L (ref 11–51)

## 2024-12-09 LAB — I-STAT CG4 LACTIC ACID, ED
Lactic Acid, Venous: 2.5 mmol/L (ref 0.5–1.9)
Lactic Acid, Venous: 1.8 mmol/L (ref 0.5–1.9)

## 2024-12-09 LAB — CBC WITH DIFFERENTIAL/PLATELET
Abs Immature Granulocytes: 0.03 K/uL (ref 0.00–0.07)
Basophils Absolute: 0 K/uL (ref 0.0–0.1)
Basophils Relative: 0 %
Eosinophils Absolute: 0 K/uL (ref 0.0–0.5)
Eosinophils Relative: 0 %
HCT: 36.8 % (ref 36.0–46.0)
Hemoglobin: 12.9 g/dL (ref 12.0–15.0)
Immature Granulocytes: 0 %
Lymphocytes Relative: 8 %
Lymphs Abs: 0.8 K/uL (ref 0.7–4.0)
MCH: 30.8 pg (ref 26.0–34.0)
MCHC: 35.1 g/dL (ref 30.0–36.0)
MCV: 87.8 fL (ref 80.0–100.0)
Monocytes Absolute: 0.3 K/uL (ref 0.1–1.0)
Monocytes Relative: 3 %
Neutro Abs: 9.5 K/uL — ABNORMAL HIGH (ref 1.7–7.7)
Neutrophils Relative %: 89 %
Platelets: 308 K/uL (ref 150–400)
RBC: 4.19 MIL/uL (ref 3.87–5.11)
RDW: 12.4 % (ref 11.5–15.5)
WBC: 10.8 K/uL — ABNORMAL HIGH (ref 4.0–10.5)
nRBC: 0 % (ref 0.0–0.2)

## 2024-12-09 LAB — RESP PANEL BY RT-PCR (RSV, FLU A&B, COVID)  RVPGX2
Influenza A by PCR: NEGATIVE
Influenza B by PCR: NEGATIVE
Resp Syncytial Virus by PCR: NEGATIVE
SARS Coronavirus 2 by RT PCR: NEGATIVE

## 2024-12-09 MED ADMIN — PROMETHAZINE (PHENERGAN) 12.5MG IN NS 50ML IVPB: 12.5 mg | INTRAVENOUS | NDC 00641092821

## 2024-12-09 MED ADMIN — Metronidazole IV Soln 500 MG/100ML: 500 mg | INTRAVENOUS | NDC 00338105548

## 2024-12-09 MED ADMIN — Sodium Chloride IV Soln 0.9%: 1000 mL | INTRAVENOUS | NDC 00264580200

## 2024-12-09 MED ADMIN — Iohexol Inj 300 MG/ML: 100 mL | INTRAVENOUS | NDC 00407141363

## 2024-12-09 MED ADMIN — CEFEPIME 2 GM IVPB: 2 g | INTRAVENOUS | NDC 00409973501

## 2024-12-09 MED ADMIN — Morphine Sulfate IV Soln PF 4 MG/ML: 4 mg | INTRAVENOUS | NDC 00641612501

## 2024-12-09 NOTE — ED Triage Notes (Addendum)
" °  Patient BIB EMS for abdominal pain and N/V/D that started around 1800.  Patient states she wasn't feeling well started at 1400 this afternoon.  Endorses 10/10 cramping in mid to lower abdomen.    Given 4 mg zofran  IV, and 500 ml NS.  "

## 2024-12-09 NOTE — ED Provider Notes (Signed)
 " [place] EMERGENCY DEPARTMENT AT [facility] Provider Note   CSN: [id number] Arrival date & time: 12/09/24  2103     Patient presents with: Emesis and Diarrhea   Yolanda Webb is a 78 y.o. female.  She is here with acute onset of nausea vomiting and diarrhea that started about 6 hours ago.  She did not see any blood in the vomit or the diarrhea.  It is associated with some cramping lower abdominal pain.  She has felt feverish and chilled but did not have a temperature at home.  No sick contacts.  Recent flight to [place] .  EMS gave her some Zofran  and IV fluids.  Remote history of diverticulitis.   The history is provided by the patient and the EMS personnel.  Emesis Severity:  Moderate Duration:  6 hours Timing:  Constant Chronicity:  New Associated symptoms: abdominal pain, chills, diarrhea and fever   Associated symptoms: no cough   Risk factors: no sick contacts and no travel to endemic areas        Prior to Admission medications  Not on File    Allergies: Penicillins    Review of Systems  Constitutional:  Positive for chills and fever.  Respiratory:  Negative for cough.   Cardiovascular:  Negative for chest pain.  Gastrointestinal:  Positive for abdominal pain, diarrhea, nausea and vomiting.  Genitourinary:  Negative for dysuria.    Updated Vital Signs BP (!) 114/56 (BP Location: Right Arm)   Pulse 60   Temp (!) 97.4 F (36.3 C) (Oral)   Resp 18   Ht 5' 5 (1.651 m)   Wt 56.7 kg   SpO2 100%   BMI 20.80 kg/m   Physical Exam Vitals and nursing note reviewed.  Constitutional:      General: She is in acute distress.     Appearance: Normal appearance. She is well-developed.  HENT:     Head: Normocephalic and atraumatic.  Eyes:     Conjunctiva/sclera: Conjunctivae normal.  Cardiovascular:     Rate and Rhythm: Normal rate and regular rhythm.     Heart sounds: No murmur heard. Pulmonary:     Effort: Pulmonary effort is normal. No  respiratory distress.     Breath sounds: Normal breath sounds.  Abdominal:     Palpations: Abdomen is soft.     Tenderness: There is abdominal tenderness (Mild lower abdominal tenderness). There is no guarding or rebound.  Musculoskeletal:        General: No deformity.     Cervical back: Neck supple.  Skin:    General: Skin is warm and dry.     Capillary Refill: Capillary refill takes less than 2 seconds.  Neurological:     General: No focal deficit present.     Mental Status: She is alert.     (all labs ordered are listed, but only abnormal results are displayed) Labs Reviewed  CBC WITH DIFFERENTIAL/PLATELET - Abnormal; Notable for the following components:      Result Value   WBC 10.8 (*)    Neutro Abs 9.5 (*)    All other components within normal limits  COMPREHENSIVE METABOLIC PANEL WITH GFR - Abnormal; Notable for the following components:   Potassium 2.9 (*)    Glucose, Bld 172 (*)    Calcium  10.7 (*)    All other components within normal limits  URINALYSIS, ROUTINE W REFLEX MICROSCOPIC - Abnormal; Notable for the following components:   Ketones, ur 5 (*)    Leukocytes,Ua  TRACE (*)    Bacteria, UA RARE (*)    All other components within normal limits  OSMOLALITY - Abnormal; Notable for the following components:   Osmolality 299 (*)    All other components within normal limits  LACTIC ACID, PLASMA - Abnormal; Notable for the following components:   Lactic Acid, Venous 2.5 (*)    All other components within normal limits  COMPREHENSIVE METABOLIC PANEL WITH GFR - Abnormal; Notable for the following components:   CO2 20 (*)    Glucose, Bld 161 (*)    Total Protein 5.9 (*)    All other components within normal limits  CBC - Abnormal; Notable for the following components:   RBC 3.67 (*)    Hemoglobin 11.4 (*)    HCT 32.5 (*)    All other components within normal limits  I-STAT CG4 LACTIC ACID, ED - Abnormal; Notable for the following components:   Lactic Acid,  Venous 2.5 (*)    All other components within normal limits  RESP PANEL BY RT-PCR (RSV, FLU A&B, COVID)  RVPGX2  GASTROINTESTINAL PANEL BY PCR, STOOL (REPLACES STOOL CULTURE)  CULTURE, BLOOD (ROUTINE X 2)  CULTURE, BLOOD (ROUTINE X 2)  C DIFFICILE QUICK SCREEN W PCR REFLEX    LIPASE, BLOOD  MAGNESIUM  CK  CREATININE, URINE, RANDOM  PREALBUMIN  SODIUM, URINE, RANDOM  TSH  T4, FREE  LACTIC ACID, PLASMA  MAGNESIUM  PHOSPHORUS  OSMOLALITY, URINE  T3  I-STAT CG4 LACTIC ACID, ED    EKG: None  Radiology: CT ABDOMEN PELVIS W CONTRAST Result Date: 12/09/2024 CLINICAL DATA:  Abdomen pain nausea vomiting diarrhea EXAM: CT ABDOMEN AND PELVIS WITH CONTRAST TECHNIQUE: Multidetector CT imaging of the abdomen and pelvis was performed using the standard protocol following bolus administration of intravenous contrast. RADIATION DOSE REDUCTION: This exam was performed according to the departmental dose-optimization program which includes automated exposure control, adjustment of the mA and/or kV according to patient size and/or use of iterative reconstruction technique. CONTRAST:  OMNIPAQUE  IOHEXOL  300 MG/ML  SOLN COMPARISON:  CT 11/01/2022 FINDINGS: Lower chest: Lung bases demonstrate no acute consolidation or effusion. Hazy dependent atelectasis at the bases. Hepatobiliary: Distended gallbladder. No calcified stones or biliary dilatation Pancreas: Unremarkable. No pancreatic ductal dilatation or surrounding inflammatory changes. Spleen: Normal in size without focal abnormality. Adrenals/Urinary Tract: Adrenal glands are normal. Kidneys show no hydronephrosis. The bladder is normal. Cyst in the left kidney, no imaging follow-up is recommended. Stomach/Bowel: The stomach is within normal limits. No dilated small bowel. Some fluid-filled small bowel in the pelvis. Mild wall thickening involving the ascending colon, hepatic flexure and proximal transverse colon with mucosal enhancement. Marked  circumferential wall thickening involving the descending and sigmoid colon, similar pattern as compared with prior exam from 2023. Large amount of stool within the colon. Negative appendix. Moderate fecal retention at the rectum Vascular/Lymphatic: Aortic atherosclerosis. No enlarged abdominal or pelvic lymph nodes. Reproductive: Uterus and bilateral adnexa are unremarkable. Other: Negative for free air. Small volume free fluid in the pelvis. Musculoskeletal: No acute or suspicious osseous abnormality IMPRESSION: 1. Marked circumferential wall thickening involving the descending and sigmoid colon, similar pattern as compared with prior exam from 2023. Mild wall thickening involving the ascending colon, hepatic flexure and proximal transverse colon with mucosal enhancement. Overall findings are suspected to represent colitis of infectious or inflammatory etiology. However given similar distribution of findings at the left colon as compared with the exam from 2023, advise colonoscopy follow-up after resolution of acute  symptoms, if not already performed. 2. Large amount of stool within the colon with moderate fecal retention at the rectum. 3. Aortic atherosclerosis. Aortic Atherosclerosis (ICD10-I70.0). Electronically Signed   By: Luke Bun M.D.   On: 12/09/2024 23:53     .Critical Care  Performed by: Towana Ozell BROCKS, MD Authorized by: Towana Ozell BROCKS, MD   Critical care provider statement:    Critical care time (minutes):  45   Critical care time was exclusive of:  Separately billable procedures and treating other patients   Critical care was necessary to treat or prevent imminent or life-threatening deterioration of the following conditions:  Sepsis and metabolic crisis   Critical care was time spent personally by me on the following activities:  Development of treatment plan with patient or surrogate, discussions with consultants, evaluation of patient's response to treatment, examination of  patient, obtaining history from patient or surrogate, ordering and performing treatments and interventions, ordering and review of laboratory studies, ordering and review of radiographic studies, pulse oximetry, re-evaluation of patient's condition and review of old charts   I assumed direction of critical care for this patient from another provider in my specialty: no      Medications Ordered in the ED  metroNIDAZOLE  (FLAGYL ) IVPB 500 mg (has no administration in time range)  acetaminophen  (TYLENOL ) tablet 650 mg (has no administration in time range)    Or  acetaminophen  (TYLENOL ) suppository 650 mg (has no administration in time range)  HYDROcodone-acetaminophen  (NORCO/VICODIN) 5-325 MG per tablet 1-2 tablet (has no administration in time range)  ondansetron  (ZOFRAN ) tablet 4 mg (has no administration in time range)    Or  ondansetron  (ZOFRAN ) injection 4 mg (has no administration in time range)  fentaNYL (SUBLIMAZE) injection 12.5-50 mcg (has no administration in time range)  ceFEPIme  (MAXIPIME ) 2 g in sodium chloride  0.9 % 100 mL IVPB (2 g Intravenous New Bag/Given 12/10/24 1043)  fentaNYL (SUBLIMAZE) injection 50 mcg (0 mcg Intravenous Hold 12/10/24 0140)  Oral care mouth rinse (has no administration in time range)  polyethylene glycol-electrolytes (NuLYTELY) solution 4,000 mL (has no administration in time range)  0.9 %  sodium chloride  infusion ( Intravenous Rate/Dose Change 12/10/24 1044)  morphine  (PF) 4 MG/ML injection 4 mg (4 mg Intravenous Given 12/09/24 2151)  sodium chloride  0.9 % bolus 1,000 mL (0 mLs Intravenous Stopped 12/09/24 2250)  promethazine  (PHENERGAN ) 12.5 mg in sodium chloride  0.9 % 50 mL IVPB (0 mg Intravenous Stopped 12/09/24 2224)  sodium chloride  0.9 % bolus 1,000 mL (0 mLs Intravenous Stopped 12/10/24 0005)  ceFEPIme  (MAXIPIME ) 2 g in sodium chloride  0.9 % 100 mL IVPB (0 g Intravenous Stopped 12/09/24 2345)    And  metroNIDAZOLE  (FLAGYL ) IVPB 500 mg (0 mg  Intravenous Stopped 12/10/24 0014)  potassium chloride  10 mEq in 100 mL IVPB (0 mEq Intravenous Stopped 12/10/24 0530)  iohexol  (OMNIPAQUE ) 300 MG/ML solution 100 mL (100 mLs Intravenous Contrast Given 12/09/24 2331)  potassium chloride  SA (KLOR-CON  M) CR tablet 40 mEq (40 mEq Oral Given 12/10/24 0116)  sodium chloride  0.9 % bolus 1,000 mL (1,000 mLs Intravenous New Bag/Given 12/10/24 0148)    Clinical Course as of 12/10/24 1043  Sun Dec 09, 2024  2133 Lactate elevated at 2.47 [MB]  2244 Blood pressure to 105.  Patient states she is feeling a little bit better.  Will continue to monitor.  Have ordered antibiotics and cultures and will put her in for CT. [MB]    Clinical Course User Index [MB] Towana Ozell  C, MD                                 Medical Decision Making Amount and/or Complexity of Data Reviewed Labs: ordered. Radiology: ordered.  Risk Prescription drug management. Decision regarding hospitalization.   This patient complains of nausea vomiting diarrhea lower abdominal pain; this involves an extensive number of treatment Options and is a complaint that carries with it a high risk of complications and morbidity. The differential includes colitis, diverticulitis, perforation bowel, infection  I ordered, reviewed and interpreted labs, which included CBC with mildly elevated white count, chemistries with low potassium, lactate mildly elevated, COVID and flu negative, stool studies sent I ordered medication to be fluids, antibiotics, IV potassium and reviewed PMP when indicated. I ordered imaging studies which included CT abdomen and pelvis and I independently    visualized and interpreted imaging which showed thickening question colitis, constipation Additional history obtained from EMS, patient's family members Previous records obtained and reviewed in epic no prior visits Cardiac monitoring reviewed, sinus rhythm Social determinants considered, no significant  barriers Critical Interventions: Initiation of fluids and IV antibiotics for possible sepsis  After the interventions stated above, I reevaluated the patient and found patient's symptoms to be improving and pain controlled Admission and further testing considered, she will need admission to the hospital for further management.  Care signed out to Dr. Trine to follow-up on final results of CT and discussion with hospitalist regarding admission.      Final diagnoses:  Nausea vomiting and diarrhea  Acute colitis  Hypotension due to hypovolemia  Hypokalemia    ED Discharge Orders     None          Towana Ozell BROCKS, MD 12/10/24 1048  "

## 2024-12-10 ENCOUNTER — Encounter (HOSPITAL_COMMUNITY): Payer: Self-pay | Admitting: Internal Medicine

## 2024-12-10 DIAGNOSIS — I10 Essential (primary) hypertension: Secondary | ICD-10-CM | POA: Diagnosis present

## 2024-12-10 DIAGNOSIS — E441 Mild protein-calorie malnutrition: Secondary | ICD-10-CM | POA: Diagnosis present

## 2024-12-10 DIAGNOSIS — E876 Hypokalemia: Secondary | ICD-10-CM | POA: Diagnosis present

## 2024-12-10 DIAGNOSIS — E872 Acidosis, unspecified: Secondary | ICD-10-CM | POA: Diagnosis present

## 2024-12-10 DIAGNOSIS — E861 Hypovolemia: Secondary | ICD-10-CM | POA: Diagnosis present

## 2024-12-10 DIAGNOSIS — A0811 Acute gastroenteropathy due to Norwalk agent: Secondary | ICD-10-CM | POA: Diagnosis present

## 2024-12-10 DIAGNOSIS — K529 Noninfective gastroenteritis and colitis, unspecified: Secondary | ICD-10-CM | POA: Diagnosis not present

## 2024-12-10 DIAGNOSIS — Z6820 Body mass index (BMI) 20.0-20.9, adult: Secondary | ICD-10-CM | POA: Diagnosis not present

## 2024-12-10 DIAGNOSIS — K5900 Constipation, unspecified: Secondary | ICD-10-CM | POA: Diagnosis present

## 2024-12-10 DIAGNOSIS — E039 Hypothyroidism, unspecified: Secondary | ICD-10-CM | POA: Diagnosis present

## 2024-12-10 DIAGNOSIS — I9589 Other hypotension: Secondary | ICD-10-CM | POA: Diagnosis present

## 2024-12-10 DIAGNOSIS — R1110 Vomiting, unspecified: Secondary | ICD-10-CM | POA: Diagnosis present

## 2024-12-10 DIAGNOSIS — E860 Dehydration: Secondary | ICD-10-CM | POA: Diagnosis present

## 2024-12-10 DIAGNOSIS — Z880 Allergy status to penicillin: Secondary | ICD-10-CM | POA: Diagnosis not present

## 2024-12-10 DIAGNOSIS — E785 Hyperlipidemia, unspecified: Secondary | ICD-10-CM | POA: Diagnosis present

## 2024-12-10 DIAGNOSIS — Z1152 Encounter for screening for COVID-19: Secondary | ICD-10-CM | POA: Diagnosis not present

## 2024-12-10 DIAGNOSIS — D649 Anemia, unspecified: Secondary | ICD-10-CM | POA: Diagnosis present

## 2024-12-10 LAB — COMPREHENSIVE METABOLIC PANEL WITH GFR
ALT: 13 U/L (ref 0–44)
AST: 23 U/L (ref 15–41)
Albumin: 3.8 g/dL (ref 3.5–5.0)
Alkaline Phosphatase: 45 U/L (ref 38–126)
Anion gap: 11 (ref 5–15)
BUN: 20 mg/dL (ref 8–23)
CO2: 20 mmol/L — ABNORMAL LOW (ref 22–32)
Calcium: 9 mg/dL (ref 8.9–10.3)
Chloride: 107 mmol/L (ref 98–111)
Creatinine, Ser: 0.83 mg/dL (ref 0.44–1.00)
GFR, Estimated: >60 mL/min
Glucose, Bld: 161 mg/dL — ABNORMAL HIGH (ref 70–99)
Potassium: 3.6 mmol/L (ref 3.5–5.1)
Sodium: 138 mmol/L (ref 135–145)
Total Bilirubin: 0.6 mg/dL (ref 0.0–1.2)
Total Protein: 5.9 g/dL — ABNORMAL LOW (ref 6.5–8.1)

## 2024-12-10 LAB — CBC
HCT: 32.5 % — ABNORMAL LOW (ref 36.0–46.0)
Hemoglobin: 11.4 g/dL — ABNORMAL LOW (ref 12.0–15.0)
MCH: 31.1 pg (ref 26.0–34.0)
MCHC: 35.1 g/dL (ref 30.0–36.0)
MCV: 88.6 fL (ref 80.0–100.0)
Platelets: 253 K/uL (ref 150–400)
RBC: 3.67 MIL/uL — ABNORMAL LOW (ref 3.87–5.11)
RDW: 12.8 % (ref 11.5–15.5)
WBC: 8.6 K/uL (ref 4.0–10.5)
nRBC: 0 % (ref 0.0–0.2)

## 2024-12-10 LAB — URINALYSIS, ROUTINE W REFLEX MICROSCOPIC
Bilirubin Urine: NEGATIVE
Glucose, UA: NEGATIVE mg/dL
Hgb urine dipstick: NEGATIVE
Ketones, ur: 5 mg/dL — AB
Nitrite: NEGATIVE
Protein, ur: NEGATIVE mg/dL
Specific Gravity, Urine: 1.028 (ref 1.005–1.030)
pH: 6 (ref 5.0–8.0)

## 2024-12-10 LAB — LACTIC ACID, PLASMA
Lactic Acid, Venous: 1.2 mmol/L (ref 0.5–1.9)
Lactic Acid, Venous: 2.5 mmol/L (ref 0.5–1.9)

## 2024-12-10 LAB — TSH: TSH: 4.14 u[IU]/mL (ref 0.350–4.500)

## 2024-12-10 LAB — CK: Total CK: 90 U/L (ref 38–234)

## 2024-12-10 LAB — PREALBUMIN: Prealbumin: 20 mg/dL (ref 18–38)

## 2024-12-10 LAB — MAGNESIUM
Magnesium: 2.3 mg/dL (ref 1.7–2.4)
Magnesium: 2.2 mg/dL (ref 1.7–2.4)

## 2024-12-10 LAB — SODIUM, URINE, RANDOM: Sodium, Ur: 91 mmol/L

## 2024-12-10 LAB — C DIFFICILE QUICK SCREEN W PCR REFLEX
C Diff antigen: NEGATIVE
C Diff interpretation: NOT DETECTED
C Diff toxin: NEGATIVE

## 2024-12-10 LAB — CREATININE, URINE, RANDOM: Creatinine, Urine: 22 mg/dL

## 2024-12-10 LAB — PHOSPHORUS: Phosphorus: 3.1 mg/dL (ref 2.5–4.6)

## 2024-12-10 LAB — OSMOLALITY: Osmolality: 299 mosm/kg — ABNORMAL HIGH (ref 275–295)

## 2024-12-10 LAB — OSMOLALITY, URINE: Osmolality, Ur: 393 mosm/kg (ref 300–900)

## 2024-12-10 LAB — T4, FREE: Free T4: 1.1 ng/dL (ref 0.80–2.00)

## 2024-12-10 MED ORDER — SODIUM CHLORIDE 0.9 % IV BOLUS
1000.0000 mL | Freq: Once | INTRAVENOUS | Status: AC
  Administered 2024-12-10: 1000 mL via INTRAVENOUS

## 2024-12-10 MED ADMIN — Potassium Chloride Inj 10 mEq/100ML: 10 meq | INTRAVENOUS | NDC 00338070948

## 2024-12-10 MED ADMIN — CEFEPIME 2 GM IVPB: 2 g | INTRAVENOUS | NDC 00409973501

## 2024-12-10 MED ADMIN — Nutritional Supplement Liquid: 1 | ORAL | NDC 04390018666

## 2024-12-10 MED ADMIN — PEG 3350-KCl-Sod Bicarb-NaCl For Soln 420 GM: 4000 mL | ORAL | NDC 10572030201

## 2024-12-10 MED ADMIN — Metronidazole IV Soln 500 MG/100ML: 500 mg | INTRAVENOUS | NDC 00338105548

## 2024-12-10 MED ADMIN — Potassium Chloride Microencapsulated Crys ER Tab 20 mEq: 40 meq | ORAL | NDC 00245531989

## 2024-12-10 NOTE — Assessment & Plan Note (Signed)
 Check CK given dehydration if elevated hold statin

## 2024-12-10 NOTE — Assessment & Plan Note (Signed)
 Allow permissive hypertension

## 2024-12-10 NOTE — Evaluation (Signed)
 Occupational Therapy Evaluation/Discharge Patient Details Name: Yolanda Webb MRN: [id number] DOB: 05-09-1946 Today's Date: 12/10/2024   History of Present Illness   Pt is a 78 y/o female presenting with N&V and abdominal pain. CT of abdomen concerning for colitis. PMH: HTN, HLD, hypothyroidism.     Clinical Impressions PTA, pt lives with spouse, typically completely Independent in all daily tasks and works part time. Pt presents now reportedly feeling much better than on admission. Pt able to manage ADLs and mobility in room Independently w/ assist only intermittently needed for IV pole. Pt denies concerns managing daily routine at home, has family support as needed. No further skilled OT services needed at this time. Recommend continued OOB activity with mobility specialists while admitted.      If plan is discharge home, recommend the following:   Other (comment) (PRN)     Functional Status Assessment   Patient has not had a recent decline in their functional status     Equipment Recommendations   None recommended by OT     Recommendations for Other Services         Precautions/Restrictions   Precautions Precautions: Fall Precaution/Restrictions Comments: low fall risk Restrictions Weight Bearing Restrictions Per Provider Order: No     Mobility Bed Mobility Overal bed mobility: Modified Independent                  Transfers Overall transfer level: Independent Equipment used: None               General transfer comment: from bed and regular toilet      Balance Overall balance assessment: Needs assistance Sitting-balance support: No upper extremity supported, Feet supported Sitting balance-Leahy Scale: Good     Standing balance support: No upper extremity supported, During functional activity Standing balance-Leahy Scale: Good                             ADL either performed or assessed with clinical judgement   ADL  Overall ADL's : Modified independent                                       General ADL Comments: Pt able to easily mobilize to/from bathroom without AD, manage toileting task and hygiene standing at sink. Pt declined to complete further mobility out of room at this time.     Vision Ability to See in Adequate Light: 0 Adequate Patient Visual Report: No change from baseline Vision Assessment?: No apparent visual deficits     Perception         Praxis         Pertinent Vitals/Pain Pain Assessment Pain Assessment: No/denies pain     Extremity/Trunk Assessment Upper Extremity Assessment Upper Extremity Assessment: Overall WFL for tasks assessed;Right hand dominant   Lower Extremity Assessment Lower Extremity Assessment: Defer to PT evaluation   Cervical / Trunk Assessment Cervical / Trunk Assessment: Normal   Communication Communication Communication: No apparent difficulties   Cognition Arousal: Alert Behavior During Therapy: WFL for tasks assessed/performed Cognition: No apparent impairments                               Following commands: Intact       Cueing  General Comments   Cueing Techniques: Verbal cues  Daughter entering  at end of session   Exercises     Shoulder Instructions      Home Living Family/patient expects to be discharged to:: Private residence Living Arrangements: Spouse/significant other;Children Available Help at Discharge: Family;Available 24 hours/day Type of Home: House Home Access: Stairs to enter [employer] of Steps: 3   Home Layout: One level;Other (Comment) (2-3 steps to add-on bedroom in the home)     Bathroom Shower/Tub: Walk-in shower;Tub/shower unit (uses walk in)   [employer]: Standard                Prior Functioning/Environment Prior Level of Function : Independent/Modified Independent;Driving;Working/employed             Mobility Comments: no AD ADLs  Comments: works part time, Chief Strategy Officer with ADLs/IADLs    OT Problem List:     OT Treatment/Interventions:        OT Goals(Current goals can be found in the care plan section)   Acute Rehab OT Goals Patient Stated Goal: home today if i can OT Goal Formulation: All assessment and education complete, DC therapy   OT Frequency:       Co-evaluation              AM-PAC OT 6 Clicks Daily Activity     Outcome Measure Help from another person eating meals?: None Help from another person taking care of personal grooming?: None Help from another person toileting, which includes using toliet, bedpan, or urinal?: None Help from another person bathing (including washing, rinsing, drying)?: None Help from another person to put on and taking off regular upper body clothing?: None Help from another person to put on and taking off regular lower body clothing?: None 6 Click Score: 24   End of Session Nurse Communication: Mobility status  Activity Tolerance: Patient tolerated treatment well Patient left: in bed;with call bell/phone within reach;with family/visitor present  OT Visit Diagnosis: Muscle weakness (generalized) (M62.81)                Time: 9149-9092 OT Time Calculation (min): 17 min Charges:  OT General Charges $OT Visit: 1 Visit OT Evaluation $OT Eval Low Complexity: 1 Low  Yolanda Webb, OTR/L Acute Rehab Services Office: [phone]   Yolanda Fish 12/10/2024, 9:17 AM

## 2024-12-10 NOTE — ED Provider Notes (Signed)
 I assumed care of this patient from previous provider.  Please see their note for further details of history, exam, and MDM.   Briefly patient is a 78 y.o. female who presented abdominal pain and diarrhea.  Workup is pending but currently concerning for sepsis likely from GI source.  Patient had leukocytosis with elevated lactic acid.  Patient is receiving IV fluids and empiric  antibiotics.  Currently awaiting CT scan.  CT is consistent with colitis. BP improving Lactic is cleared.  Consulting Hospitalist to admit.       [name], MD 12/10/24 [phone]

## 2024-12-10 NOTE — Assessment & Plan Note (Signed)
Replace and recheck check magnesium level

## 2024-12-10 NOTE — Assessment & Plan Note (Signed)
 Check TSH and resume Synthroid 

## 2024-12-10 NOTE — Subjective & Objective (Signed)
 Pt presets with abd pain N/v/D started around 6 PM Has not felt well since 2 PM today severe abdominal pain feeling the cramping No blood in vomitus or diarrhea  She have had some chills and associated fevers  CT scan worrisome for colitis May have large amount of stool present and moderate fecal retention

## 2024-12-10 NOTE — Progress Notes (Signed)
 "  [facility] PROGRESS NOTE   Yolanda Webb FMW:[id number] DOB: [birth year] DOA: 12/09/2024  PCP: Cleotilde Planas, MD  Brief History: 78 y.o. female with medical history significant of HTN, HLD, hypothyrodism who presented with nausea vomiting abdominal pain.  Also had diarrhea.  Imaging studies raise concern for colitis.  Patient was hospitalized for further management.   Consultants: Gastroenterology  Procedures: None yet    Subjective/Interval History: Patient had loose stool earlier this morning.  Had about 5 bowel movements in the last 24 hours.  She mentioned that she primarily presented with lower back pain which has improved in the last 24 hours.  No nausea vomiting.    Assessment/Plan:  Acute Colitis Patient mentioned that she has had loose stools.  CT scan however showed a lot of stool in her colon.  Concern also raised for moderate fecal retention in the rectum.    She reports an unremarkable colonoscopy within the last 1 to 2 years. GI has been consulted to assist with management. CT scan also showed evidence for colitis. GI pathogen panel was ordered.  C. difficile has been added.  She is empirically on cefepime  and metronidazole  Abdomen is benign on examination this morning.  She is afebrile with normal WBC this morning. She did have lactic acidosis for which she has received IV fluid bolus.  This is most likely due to hypovolemia.  Hypokalemia Supplemented.  Magnesium is 2.2.  Normocytic anemia No evidence of overt bleeding.  Hypothyroidism Continue levothyroxine .  TSH and free T4 levels are normal.  Essential hypertension Borderline low blood pressures noted.  Not on any blood pressure lowering agents currently.  DVT Prophylaxis: SCDs for now Code Status: Full code Family Communication: Discussed with patient Disposition Plan: Home when improved  Status is: Inpatient Remains inpatient appropriate because: Acute colitis    Medications: Scheduled:   docusate sodium   100 mg Oral BID   fentaNYL  (SUBLIMAZE ) injection  50 mcg Intravenous Once   Continuous:  sodium chloride  125 mL/hr at 12/10/24 0855   ceFEPime  (MAXIPIME ) IV     metronidazole      PRN:acetaminophen  **OR** acetaminophen , bisacodyl , fentaNYL  (SUBLIMAZE ) injection, HYDROcodone -acetaminophen , ondansetron  **OR** ondansetron  (ZOFRAN ) IV, mouth rinse  Antibiotics: Anti-infectives (From admission, onward)    Start     Dose/Rate Route Frequency Ordered Stop   12/10/24 1100  metroNIDAZOLE  (FLAGYL ) IVPB 500 mg       Placed in And Linked Group   500 mg 100 mL/hr over 60 Minutes Intravenous Every 12 hours 12/10/24 0055     12/10/24 1100  ceFEPIme  (MAXIPIME ) 2 g in sodium chloride  0.9 % 100 mL IVPB        2 g 200 mL/hr over 30 Minutes Intravenous Every 12 hours 12/10/24 0103     12/10/24 0100  ceFEPIme  (MAXIPIME ) 2 g in sodium chloride  0.9 % 100 mL IVPB  Status:  Discontinued       Placed in And Linked Group   2 g 200 mL/hr over 30 Minutes Intravenous  Once 12/10/24 0055 12/10/24 0101   12/09/24 2245  ceFEPIme  (MAXIPIME ) 2 g in sodium chloride  0.9 % 100 mL IVPB       Placed in And Linked Group   2 g 200 mL/hr over 30 Minutes Intravenous  Once 12/09/24 2237 12/09/24 2345   12/09/24 2245  metroNIDAZOLE  (FLAGYL ) IVPB 500 mg       Placed in And Linked Group   500 mg 100 mL/hr over 60 Minutes Intravenous  Once 12/09/24 2237 12/10/24 0014  Objective:  Vital Signs  Vitals:   12/10/24 0217 12/10/24 0229 12/10/24 0630 12/10/24 0632  BP: 112/62  (!) 103/52 (!) 100/48  Pulse: 68  64 61  Resp: 18  18 18   Temp: 98 F (36.7 C)  98.2 F (36.8 C) 98.2 F (36.8 C)  TempSrc: Oral  Oral Oral  SpO2: 100%  99% 98%  Weight:  59.2 kg    Height:  5' 5 (1.651 m)      Intake/Output Summary (Last 24 hours) at 12/10/2024 1014 Last data filed at 12/10/2024 0900 Gross per 24 hour  Intake 1025.99 ml  Output --  Net 1025.99 ml   Filed Weights   12/09/24 2113 12/10/24  0229  Weight: 56.7 kg 59.2 kg    General appearance: Awake alert.  In no distress Resp: Clear to auscultation bilaterally.  Normal effort Cardio: S1-S2 is normal regular.  No S3-S4.  No rubs murmurs or bruit GI: Abdomen is soft.  Nontender nondistended.  Bowel sounds are present normal.  No masses organomegaly Extremities: No edema.  Full range of motion of lower extremities. Neurologic: Alert and oriented x3.  No focal neurological deficits.    Lab Results:  Data Reviewed: I have personally reviewed following labs and reports of the imaging studies  CBC: Recent Labs  Lab 12/09/24 2123 12/10/24 0329  WBC 10.8* 8.6  NEUTROABS 9.5*  --   HGB 12.9 11.4*  HCT 36.8 32.5*  MCV 87.8 88.6  PLT 308 253    Basic Metabolic Panel: Recent Labs  Lab 12/09/24 2123 12/10/24 0329  NA 139 138  K 2.9* 3.6  CL 103 107  CO2 23 20*  GLUCOSE 172* 161*  BUN 23 20  CREATININE 0.96 0.83  CALCIUM  10.7* 9.0  MG 2.3 2.2  PHOS  --  3.1    GFR: Estimated Creatinine Clearance: 51.9 mL/min (by C-G formula based on SCr of 0.83 mg/dL).  Liver Function Tests: Recent Labs  Lab 12/09/24 2123 12/10/24 0329  AST 29 23  ALT 14 13  ALKPHOS 54 45  BILITOT 0.8 0.6  PROT 6.8 5.9*  ALBUMIN 4.4 3.8    Recent Labs  Lab 12/09/24 2123  LIPASE 39    Cardiac Enzymes: Recent Labs  Lab 12/10/24 0329  CKTOTAL 90    Thyroid Function Tests: Recent Labs    12/10/24 0329  TSH 4.140  FREET4 1.10    Recent Results (from the past 240 hours)  Resp panel by RT-PCR (RSV, Flu A&B, Covid) Anterior Nasal Swab     Status: None   Collection Time: 12/09/24  9:23 PM   Specimen: Anterior Nasal Swab  Result Value Ref Range Status   SARS Coronavirus 2 by RT PCR NEGATIVE NEGATIVE Final    Comment: (NOTE) SARS-CoV-2 target nucleic acids are NOT DETECTED.  The SARS-CoV-2 RNA is generally detectable in upper respiratory specimens during the acute phase of infection. The lowest concentration of  SARS-CoV-2 viral copies this assay can detect is 138 copies/mL. A negative result does not preclude SARS-Cov-2 infection and should not be used as the sole basis for treatment or other patient management decisions. A negative result may occur with  improper specimen collection/handling, submission of specimen other than nasopharyngeal swab, presence of viral mutation(s) within the areas targeted by this assay, and inadequate number of viral copies(<138 copies/mL). A negative result must be combined with clinical observations, patient history, and epidemiological information. The expected result is Negative.  Fact Sheet for Patients:  bloggercourse.com  Fact  Sheet for Healthcare Providers:  seriousbroker.it  This test is no t yet approved or cleared by the [place]  FDA and  has been authorized for detection and/or diagnosis of SARS-CoV-2 by FDA under an Emergency Use Authorization (EUA). This EUA will remain  in effect (meaning this test can be used) for the duration of the COVID-19 declaration under Section 564(b)(1) of the Act, 21 U.S.C.section 360bbb-3(b)(1), unless the authorization is terminated  or revoked sooner.       Influenza A by PCR NEGATIVE NEGATIVE Final   Influenza B by PCR NEGATIVE NEGATIVE Final    Comment: (NOTE) The Xpert Xpress SARS-CoV-2/FLU/RSV plus assay is intended as an aid in the diagnosis of influenza from Nasopharyngeal swab specimens and should not be used as a sole basis for treatment. Nasal washings and aspirates are unacceptable for Xpert Xpress SARS-CoV-2/FLU/RSV testing.  Fact Sheet for Patients: bloggercourse.com  Fact Sheet for Healthcare Providers: seriousbroker.it  This test is not yet approved or cleared by the [place]  FDA and has been authorized for detection and/or diagnosis of SARS-CoV-2 by FDA under an Emergency Use  Authorization (EUA). This EUA will remain in effect (meaning this test can be used) for the duration of the COVID-19 declaration under Section 564(b)(1) of the Act, 21 U.S.C. section 360bbb-3(b)(1), unless the authorization is terminated or revoked.     Resp Syncytial Virus by PCR NEGATIVE NEGATIVE Final    Comment: (NOTE) Fact Sheet for Patients: bloggercourse.com  Fact Sheet for Healthcare Providers: seriousbroker.it  This test is not yet approved or cleared by the [place]  FDA and has been authorized for detection and/or diagnosis of SARS-CoV-2 by FDA under an Emergency Use Authorization (EUA). This EUA will remain in effect (meaning this test can be used) for the duration of the COVID-19 declaration under Section 564(b)(1) of the Act, 21 U.S.C. section 360bbb-3(b)(1), unless the authorization is terminated or revoked.  Performed at [facility], [address], [place], [place] [address]       Radiology Studies: CT ABDOMEN PELVIS W CONTRAST Result Date: 12/09/2024 CLINICAL DATA:  Abdomen pain nausea vomiting diarrhea EXAM: CT ABDOMEN AND PELVIS WITH CONTRAST TECHNIQUE: Multidetector CT imaging of the abdomen and pelvis was performed using the standard protocol following bolus administration of intravenous contrast. RADIATION DOSE REDUCTION: This exam was performed according to the departmental dose-optimization program which includes automated exposure control, adjustment of the mA and/or kV according to patient size and/or use of iterative reconstruction technique. CONTRAST:  OMNIPAQUE  IOHEXOL  300 MG/ML  SOLN COMPARISON:  CT 11/01/2022 FINDINGS: Lower chest: Lung bases demonstrate no acute consolidation or effusion. Hazy dependent atelectasis at the bases. Hepatobiliary: Distended gallbladder. No calcified stones or biliary dilatation Pancreas: Unremarkable. No pancreatic ductal dilatation or  surrounding inflammatory changes. Spleen: Normal in size without focal abnormality. Adrenals/Urinary Tract: Adrenal glands are normal. Kidneys show no hydronephrosis. The bladder is normal. Cyst in the left kidney, no imaging follow-up is recommended. Stomach/Bowel: The stomach is within normal limits. No dilated small bowel. Some fluid-filled small bowel in the pelvis. Mild wall thickening involving the ascending colon, hepatic flexure and proximal transverse colon with mucosal enhancement. Marked circumferential wall thickening involving the descending and sigmoid colon, similar pattern as compared with prior exam from 2023. Large amount of stool within the colon. Negative appendix. Moderate fecal retention at the rectum Vascular/Lymphatic: Aortic atherosclerosis. No enlarged abdominal or pelvic lymph nodes. Reproductive: Uterus and bilateral adnexa are unremarkable. Other: Negative for free air. Small volume free  fluid in the pelvis. Musculoskeletal: No acute or suspicious osseous abnormality IMPRESSION: 1. Marked circumferential wall thickening involving the descending and sigmoid colon, similar pattern as compared with prior exam from 2023. Mild wall thickening involving the ascending colon, hepatic flexure and proximal transverse colon with mucosal enhancement. Overall findings are suspected to represent colitis of infectious or inflammatory etiology. However given similar distribution of findings at the left colon as compared with the exam from 2023, advise colonoscopy follow-up after resolution of acute symptoms, if not already performed. 2. Large amount of stool within the colon with moderate fecal retention at the rectum. 3. Aortic atherosclerosis. Aortic Atherosclerosis (ICD10-I70.0). Electronically Signed   By: Luke Bun M.D.   On: 12/09/2024 23:53       LOS: 0 days   Yolanda Webb Verdene  [facility] Pager on [email or web address]  12/10/2024, 10:14 AM   "

## 2024-12-10 NOTE — Assessment & Plan Note (Signed)
 Started on cefepime  flagyl  Will rehydrate Once stable may benefit from colonoscopy to further evaluate recurrent etiologies

## 2024-12-10 NOTE — Assessment & Plan Note (Signed)
Rehydrate and follow

## 2024-12-10 NOTE — Evaluation (Addendum)
 Physical Therapy Evaluation Patient Details Name: Yolanda Webb MRN: [id number] DOB: 1946-12-12 Today's Date: 12/10/2024  History of Present Illness  Pt is a 78 y/o female presenting with N&V and abdominal pain. CT of abdomen concerning for colitis. PMH: HTN, HLD, hypothyroidism.  Clinical Impression   The patient ambulated in hall with no device, reports sight balance loss when turning. Patient  does not require further PT at this time. Patient hopes to Dc home soon.    PT will sign off.      If plan is discharge home, recommend the following: Assist for transportation;Assistance with cooking/housework   Can travel by private vehicle        Equipment Recommendations None recommended by PT  Recommendations for Other Services       Functional Status Assessment Patient has not had a recent decline in their functional status     Precautions / Restrictions Precautions Precautions: Fall Precaution/Restrictions Comments: low fall risk Restrictions Weight Bearing Restrictions Per Provider Order: No      Mobility  Bed Mobility Overal bed mobility: Independent                  Transfers Overall transfer level: Independent                      Ambulation/Gait Ambulation/Gait assistance: Supervision Gait Distance (Feet): 220 Feet Assistive device: None Gait Pattern/deviations: Step-through pattern, Drifts right/left Gait velocity: decr     General Gait Details: slight  imbalance when turned  Stairs            Wheelchair Mobility     Tilt Bed    Modified Rankin (Stroke Patients Only)       Balance Overall balance assessment: Needs assistance Sitting-balance support: No upper extremity supported, Feet supported Sitting balance-Leahy Scale: Good     Standing balance support: No upper extremity supported, During functional activity Standing balance-Leahy Scale: Good                               Pertinent Vitals/Pain Pain  Assessment Pain Assessment: No/denies pain    Home Living Family/patient expects to be discharged to:: Private residence Living Arrangements: Spouse/significant other;Children Available Help at Discharge: Family;Available 24 hours/day Type of Home: House Home Access: Stairs to enter Entrance Stairs-Rails: Can reach both Entrance Stairs-Number of Steps: 3 Alternate Level Stairs-Number of Steps: 3 Home Layout: Other (Comment);Multi-level Home Equipment: None      Prior Function Prior Level of Function : Independent/Modified Independent;Driving;Working/employed             Mobility Comments: no AD ADLs Comments: works part time, Chief Strategy Officer with ADLs/IADLs     Extremity/Trunk Assessment   Upper Extremity Assessment Upper Extremity Assessment: Overall WFL for tasks assessed    Lower Extremity Assessment Lower Extremity Assessment: Generalized weakness    Cervical / Trunk Assessment Cervical / Trunk Assessment: Normal  Communication        Cognition Arousal: Alert Behavior During Therapy: WFL for tasks assessed/performed   PT - Cognitive impairments: No apparent impairments                                 Cueing       General Comments      Exercises     Assessment/Plan    PT Assessment Patient does not need any further PT services  PT Problem List         PT Treatment Interventions      PT Goals (Current goals can be found in the Care Plan section)  Acute Rehab PT Goals Patient Stated Goal: home PT Goal Formulation: All assessment and education complete, DC therapy    Frequency       Co-evaluation               AM-PAC PT 6 Clicks Mobility  Outcome Measure Help needed turning from your back to your side while in a flat bed without using bedrails?: None Help needed moving from lying on your back to sitting on the side of a flat bed without using bedrails?: None Help needed moving to and from a bed to a chair (including a  wheelchair)?: None Help needed standing up from a chair using your arms (e.g., wheelchair or bedside chair)?: None Help needed to walk in hospital room?: None Help needed climbing 3-5 steps with a railing? : A Little 6 Click Score: 23    End of Session   Activity Tolerance: Patient tolerated treatment well Patient left: in bed;with call bell/phone within reach;with family/visitor present Nurse Communication: Mobility status PT Visit Diagnosis: Unsteadiness on feet (R26.81);Other abnormalities of gait and mobility (R26.89)    Time: 1040 1054 PT Time Calculation (min) (ACUTE ONLY): 14 min   Charges:   PT Evaluation $PT Eval Low Complexity: 1 Low   PT General Charges $$ ACUTE PT VISIT: 1 Visit         Darice Potters PT Acute Rehabilitation Services Office [phone]   Potters Darice Norris 12/10/2024, 1:38 PM

## 2024-12-10 NOTE — Assessment & Plan Note (Signed)
 Patient endorsing diarrhea but CT scan is actually worrisome for due to retained stool patient may be stooling around fecal retention in the rectum  Can attempt to use suppository  May need enema if does not help

## 2024-12-10 NOTE — Consult Note (Signed)
 [facility] Consult  Referring Provider: [facility] hospitalist Primary Care Physician:  Cleotilde Planas, MD Primary Gastroenterologist: Dr. Lorinda GI  Reason for Consultation:  Abnormal CAT scan, concern for stool retention  HPI: Yolanda Webb is a 78 y.o. female was in her usual state of health until yesterday evening when she developed lower abdominal and back pain associated with nausea and vomiting.  Her daughter and her husband had to go inside the restroom as they were concerned she was in there for a long time, and discovered her in pain, flushed face and decided to bring her to the ER. Patient states she was in [place]  a week ago and had nausea and vomiting without fever/chills or rigors and it subsided on its own. Yesterday she had some bowel movement but denies improvement in abdominal pain thereafter.  Subsequently she had few more loose stools, after she came to the ER and was given pain medication, her back pain and lower abdominal pain have improved to 2 out of 10 in intensity. Patient denies constipation and feels that she has about 2 bowel movements every day.  She lost her son unexpectedly in June, has a decreased appetite but denies unintentional weight loss. She denies acid reflux, heartburn, difficulty swallowing, pain on swallowing, noticing blood in stool or black stools.  Previous GI workup: Colonoscopy, Dr. Rosalie, 01/26/2023: 2 tubular adenomas were removed, 1 hyperplastic polyp was removed, diverticulosis noted in sigmoid, transverse, descending colon, external hemorrhoids, repeat recommended in 5 years    Past Medical History:  Diagnosis Date   Hypothyroidism     History reviewed. No pertinent surgical history.  Prior to Admission medications  Not on File    Current Facility-Administered Medications  Medication Dose Route Frequency Provider Last Rate Last Admin   0.9 %  sodium chloride  infusion   Intravenous Continuous Krishnan, Gokul, MD        acetaminophen  (TYLENOL ) tablet 650 mg  650 mg Oral Q6H PRN Doutova, Anastassia, MD       Or   acetaminophen  (TYLENOL ) suppository 650 mg  650 mg Rectal Q6H PRN Doutova, Anastassia, MD       bisacodyl  (DULCOLAX) suppository 10 mg  10 mg Rectal Daily PRN Doutova, Anastassia, MD       ceFEPIme  (MAXIPIME ) 2 g in sodium chloride  0.9 % 100 mL IVPB  2 g Intravenous Q12H Poindexter, Leann T, RPH       docusate sodium  (COLACE) capsule 100 mg  100 mg Oral BID Doutova, Anastassia, MD       fentaNYL  (SUBLIMAZE ) injection 12.5-50 mcg  12.5-50 mcg Intravenous Q2H PRN Doutova, Anastassia, MD       fentaNYL  (SUBLIMAZE ) injection 50 mcg  50 mcg Intravenous Once Doutova, Anastassia, MD       HYDROcodone -acetaminophen  (NORCO/VICODIN) 5-325 MG per tablet 1-2 tablet  1-2 tablet Oral Q4H PRN Doutova, Anastassia, MD       metroNIDAZOLE  (FLAGYL ) IVPB 500 mg  500 mg Intravenous Q12H Doutova, Anastassia, MD       ondansetron  (ZOFRAN ) tablet 4 mg  4 mg Oral Q6H PRN Doutova, Anastassia, MD       Or   ondansetron  (ZOFRAN ) injection 4 mg  4 mg Intravenous Q6H PRN Doutova, Anastassia, MD       Oral care mouth rinse  15 mL Mouth Rinse PRN Doutova, Anastassia, MD       polyethylene glycol-electrolytes (NuLYTELY) solution 4,000 mL  4,000 mL Oral Once Skyllar Notarianni, MD        Allergies as  of 12/09/2024 - Reviewed 12/09/2024  Allergen Reaction Noted   Penicillins Rash 12/09/2024    History reviewed. No pertinent family history.  Social History   Socioeconomic History   Marital status: Married    Spouse name: Not on file   Number of children: Not on file   Years of education: Not on file   Highest education level: Not on file  Occupational History   Not on file  Tobacco Use   Smoking status: Never   Smokeless tobacco: Never  Substance and Sexual Activity   Alcohol use: Yes    Comment: rare   Drug use: Not on file   Sexual activity: Not on file  Other Topics Concern   Not on file  Social History Narrative   Not  on file   Social Drivers of Health   Tobacco Use: Low Risk (12/10/2024)   Patient History    Smoking Tobacco Use: Never    Smokeless Tobacco Use: Never    Passive Exposure: Not on file  Financial Resource Strain: Not on file  Food Insecurity: No Food Insecurity (12/10/2024)   Epic    Worried About Programme Researcher, Broadcasting/film/video in the Last Year: Never true    Ran Out of Food in the Last Year: Never true  Transportation Needs: No Transportation Needs (12/10/2024)   Epic    Lack of Transportation (Medical): No    Lack of Transportation (Non-Medical): No  Physical Activity: Not on file  Stress: Not on file  Social Connections: Socially Integrated (12/10/2024)   Social Connection and Isolation Panel    Frequency of Communication with Friends and Family: More than three times a week    Frequency of Social Gatherings with Friends and Family: Once a week    Attends Religious Services: 1 to 4 times per year    Active Member of [employer] or Organizations: No    Attends Banker Meetings: 1 to 4 times per year    Marital Status: Married  Catering Manager Violence: Not At Risk (12/10/2024)   Epic    Fear of Current or Ex-Partner: No    Emotionally Abused: No    Physically Abused: No    Sexually Abused: No  Depression (PHQ2-9): Not on file  Alcohol Screen: Not on file  Housing: Low Risk (12/10/2024)   Epic    Unable to Pay for Housing in the Last Year: No    Number of Times Moved in the Last Year: 0    Homeless in the Last Year: No  Utilities: Not At Risk (12/10/2024)   Epic    Threatened with loss of utilities: No  Health Literacy: Not on file    Review of Systems: As per HPI Physical Exam: Vital signs in last 24 hours: Temp:  [97.4 F (36.3 C)-98.2 F (36.8 C)] 98.2 F (36.8 C) (12/22 0632) Pulse Rate:  [56-68] 61 (12/22 0632) Resp:  [13-21] 18 (12/22 0632) BP: (98-124)/(48-73) 100/48 (12/22 0632) SpO2:  [95 %-100 %] 98 % (12/22 9367) Weight:  [56.7 kg-59.2 kg] 59.2 kg  (12/22 0229) Last BM Date : 12/10/24  General:   Alert,  Well-developed, well-nourished, pleasant and cooperative in NAD Head:  Normocephalic and atraumatic. Eyes:  Sclera clear, no icterus.   Conjunctiva pink. Ears:  Normal auditory acuity. Nose:  No deformity, discharge,  or lesions. Mouth:  No deformity or lesions.  Oropharynx pink & moist. Neck:  Supple; no masses or thyromegaly. Lungs:  Clear throughout to auscultation.   No  wheezes, crackles, or rhonchi. No acute distress. Heart:  Regular rate and rhythm; no murmurs, clicks, rubs,  or gallops. Extremities:  Without clubbing or edema. Neurologic:  Alert and  oriented x4;  grossly normal neurologically. Skin:  Intact without significant lesions or rashes. Psych:  Alert and cooperative. Normal mood and affect. Abdomen:  Soft, mild lower abdominal and left lower quadrant tenderness and nondistended. No masses, hepatosplenomegaly or hernias noted. Normal bowel sounds, without guarding, and without rebound.         Lab Results: Recent Labs    12/09/24 2123 12/10/24 0329  WBC 10.8* 8.6  HGB 12.9 11.4*  HCT 36.8 32.5*  PLT 308 253   BMET Recent Labs    12/09/24 2123 12/10/24 0329  NA 139 138  K 2.9* 3.6  CL 103 107  CO2 23 20*  GLUCOSE 172* 161*  BUN 23 20  CREATININE 0.96 0.83  CALCIUM  10.7* 9.0   LFT Recent Labs    12/10/24 0329  PROT 5.9*  ALBUMIN 3.8  AST 23  ALT 13  ALKPHOS 45  BILITOT 0.6   PT/INR No results for input(s): LABPROT, INR in the last 72 hours.  Studies/Results: CT ABDOMEN PELVIS W CONTRAST Result Date: 12/09/2024 CLINICAL DATA:  Abdomen pain nausea vomiting diarrhea EXAM: CT ABDOMEN AND PELVIS WITH CONTRAST TECHNIQUE: Multidetector CT imaging of the abdomen and pelvis was performed using the standard protocol following bolus administration of intravenous contrast. RADIATION DOSE REDUCTION: This exam was performed according to the departmental dose-optimization program which includes  automated exposure control, adjustment of the mA and/or kV according to patient size and/or use of iterative reconstruction technique. CONTRAST:  OMNIPAQUE  IOHEXOL  300 MG/ML  SOLN COMPARISON:  CT 11/01/2022 FINDINGS: Lower chest: Lung bases demonstrate no acute consolidation or effusion. Hazy dependent atelectasis at the bases. Hepatobiliary: Distended gallbladder. No calcified stones or biliary dilatation Pancreas: Unremarkable. No pancreatic ductal dilatation or surrounding inflammatory changes. Spleen: Normal in size without focal abnormality. Adrenals/Urinary Tract: Adrenal glands are normal. Kidneys show no hydronephrosis. The bladder is normal. Cyst in the left kidney, no imaging follow-up is recommended. Stomach/Bowel: The stomach is within normal limits. No dilated small bowel. Some fluid-filled small bowel in the pelvis. Mild wall thickening involving the ascending colon, hepatic flexure and proximal transverse colon with mucosal enhancement. Marked circumferential wall thickening involving the descending and sigmoid colon, similar pattern as compared with prior exam from 2023. Large amount of stool within the colon. Negative appendix. Moderate fecal retention at the rectum Vascular/Lymphatic: Aortic atherosclerosis. No enlarged abdominal or pelvic lymph nodes. Reproductive: Uterus and bilateral adnexa are unremarkable. Other: Negative for free air. Small volume free fluid in the pelvis. Musculoskeletal: No acute or suspicious osseous abnormality IMPRESSION: 1. Marked circumferential wall thickening involving the descending and sigmoid colon, similar pattern as compared with prior exam from 2023. Mild wall thickening involving the ascending colon, hepatic flexure and proximal transverse colon with mucosal enhancement. Overall findings are suspected to represent colitis of infectious or inflammatory etiology. However given similar distribution of findings at the left colon as compared with the exam  from 2023, advise colonoscopy follow-up after resolution of acute symptoms, if not already performed. 2. Large amount of stool within the colon with moderate fecal retention at the rectum. 3. Aortic atherosclerosis. Aortic Atherosclerosis (ICD10-I70.0). Electronically Signed   By: Luke Bun M.D.   On: 12/09/2024 23:53    Impression: Abdominal pain, nausea, vomiting  CT: Marked circumferential wall thickening involving descending and sigmoid, mild  wall thickening involving ascending, hepatic flexure and proximal transverse?  Infectious versus inflammatory colitis Large amount of stool within colon with moderate fecal retention in rectum  Mild acidosis, bicarb 20 Mild malnutrition, total protein 5.9 with albumin normal at 3.8 Lactic acidosis, 2.5 Normocytic anemia, hemoglobin 11.4 GI pathogen panel pending C. difficile pending  Plan: Clinical picture compatible with colitis, possibly related to increased stool burden stool burden noted on CAT scan. Patient may benefit from colonic prep, I have placed order for Nulytely, advised patient to sip on it with a straw, 4 to 8 ounces at a time, with the intention to flush her colon out, which will likely improve abdominal and back pain.  She has empirically been started on IV Flagyl  500 mg every 12 hours and IV cefepime  2 g every 12 hours.  I have discontinued Dulcolax suppository and Colace.  Continue on clear liquid diet, possibly advance her diet tomorrow depending upon clinical improvement and tolerance to bowel prep.  I will continue to follow.   LOS: 0 days   Estelita Manas, MD  12/10/2024, 10:27 AM

## 2024-12-10 NOTE — Assessment & Plan Note (Signed)
 In the setting of dehydration will rehydrate with IV fluids and follow

## 2024-12-10 NOTE — H&P (Signed)
 "    Yolanda Webb FMW:[id number] DOB: January 17, 1946 DOA: 12/09/2024     PCP: Cleotilde Planas, MD     Patient arrived to ER on 12/09/24 at 2103 Referred by Attending Towana Ozell BROCKS, MD   Patient coming from:    home Lives  With family    Chief Complaint:   Chief Complaint  Patient presents with   Emesis   Diarrhea    HPI: Yolanda Webb is a 78 y.o. female with medical history significant of HTN, HLD, hypothyrodism    Presented with   nausea vomiting abdominal pain Pt presets with abd pain N/v/D started around 6 PM Has not felt well since 2 PM today severe abdominal pain feeling the cramping No blood in vomitus or diarrhea  She have had some chills and associated fevers  CT scan worrisome for colitis May have large amount of stool present and moderate fecal retention    Reports significant abd pain  worse in the lower abd Radiating to her back    Denies significant ETOH intake   Does not smoke      Regarding pertinent Chronic problems:    Hyperlipidemia -  states she is unsure she is taking any    HTN  pt is unsure about her meds     Hypothyroidism:  on synthroid  need to confirm does in AM     While in ER: Clinical Course as of 12/10/24 0047  Sun Dec 09, 2024  2133 Lactate elevated at 2.47 [MB]  2244 Blood pressure to 105.  Patient states she is feeling a little bit better.  Will continue to monitor.  Have ordered antibiotics and cultures and will put her in for CT. [MB]    Clinical Course User Index [MB] Towana Ozell BROCKS, MD       Lab Orders         Resp panel by RT-PCR (RSV, Flu A&B, Covid) Anterior Nasal Swab         Gastrointestinal Panel by PCR , Stool         Culture, blood (routine x 2)         CBC with Differential         Comprehensive metabolic panel         Lipase, blood         Urinalysis, Routine w reflex microscopic -Urine, Clean Catch         Magnesium         I-Stat Lactic Acid       CTabd/pelvis -  1. Marked circumferential wall  thickening involving the descending and sigmoid colon, similar pattern as compared with prior exam from 2023. Mild wall thickening involving the ascending colon, hepatic flexure and proximal transverse colon with mucosal enhancement. Overall findings are suspected to represent colitis of infectious or inflammatory etiology. However given similar distribution of findings at the left colon as compared with the exam from 2023, advise colonoscopy follow-up after resolution of acute symptoms, if not already performed. 2. Large amount of stool within the colon with moderate fecal retention at the rectum. 3. Aortic atherosclerosis.     Following Medications were ordered in ER: Medications  potassium chloride  10 mEq in 100 mL IVPB (10 mEq Intravenous New Bag/Given 12/10/24 0006)  HYDROmorphone  (DILAUDID ) injection 0.5 mg (has no administration in time range)  morphine  (PF) 4 MG/ML injection 4 mg (4 mg Intravenous Given 12/09/24 2151)  sodium chloride  0.9 % bolus 1,000 mL (0 mLs Intravenous Stopped 12/09/24 2250)  promethazine  (PHENERGAN ) 12.5 mg in sodium chloride  0.9 % 50 mL IVPB (0 mg Intravenous Stopped 12/09/24 2224)  sodium chloride  0.9 % bolus 1,000 mL (0 mLs Intravenous Stopped 12/10/24 0005)  ceFEPIme  (MAXIPIME ) 2 g in sodium chloride  0.9 % 100 mL IVPB (0 g Intravenous Stopped 12/09/24 2345)    And  metroNIDAZOLE  (FLAGYL ) IVPB 500 mg (500 mg Intravenous New Bag/Given 12/09/24 2314)  iohexol  (OMNIPAQUE ) 300 MG/ML solution 100 mL (100 mLs Intravenous Contrast Given 12/09/24 2331)        ED Triage Vitals  Encounter Vitals Group     BP 12/09/24 2116 (!) 114/56     Girls Systolic BP Percentile --      Girls Diastolic BP Percentile --      Boys Systolic BP Percentile --      Boys Diastolic BP Percentile --      Pulse Rate 12/09/24 2116 60     Resp 12/09/24 2116 18     Temp 12/09/24 2116 (!) 97.4 F (36.3 C)     Temp Source 12/09/24 2116 Oral     SpO2 12/09/24 2116 100 %     Weight  12/09/24 2113 125 lb (56.7 kg)     Height 12/09/24 2113 5' 5 (1.651 m)     Head Circumference --      Peak Flow --      Pain Score 12/09/24 2112 10     Pain Loc --      Pain Education --      Exclude from Growth Chart --   UFJK(75)@     _________________________________________ Significant initial  Findings: Abnormal Labs Reviewed  CBC WITH DIFFERENTIAL/PLATELET - Abnormal; Notable for the following components:      Result Value   WBC 10.8 (*)    Neutro Abs 9.5 (*)    All other components within normal limits  COMPREHENSIVE METABOLIC PANEL WITH GFR - Abnormal; Notable for the following components:   Potassium 2.9 (*)    Glucose, Bld 172 (*)    Calcium  10.7 (*)    All other components within normal limits  URINALYSIS, ROUTINE W REFLEX MICROSCOPIC - Abnormal; Notable for the following components:   Ketones, ur 5 (*)    Leukocytes,Ua TRACE (*)    Bacteria, UA RARE (*)    All other components within normal limits  I-STAT CG4 LACTIC ACID, ED - Abnormal; Notable for the following components:   Lactic Acid, Venous 2.5 (*)    All other components within normal limits    The recent clinical data is shown below. Vitals:   12/09/24 2315 12/09/24 2330 12/09/24 2345 12/10/24 0000  BP: (!) 115/52 (!) 122/54 (!) 115/56 124/64  Pulse: 65 65 63 66  Resp: 18 19 15 14   Temp:      TempSrc:      SpO2: 99% 98% 98% 100%  Weight:      Height:        WBC     Component Value Date/Time   WBC 10.8 (H) 12/09/2024 2123   LYMPHSABS 0.8 12/09/2024 2123   MONOABS 0.3 12/09/2024 2123   EOSABS 0.0 12/09/2024 2123   BASOSABS 0.0 12/09/2024 2123    Lactic Acid, Venous    Component Value Date/Time   LATICACIDVEN 1.8 12/09/2024 2328     Results for orders placed or performed during the hospital encounter of 12/09/24  Resp panel by RT-PCR (RSV, Flu A&B, Covid) Anterior Nasal Swab     Status: None   Collection Time:  12/09/24  9:23 PM   Specimen: Anterior Nasal Swab  Result Value Ref Range  Status   SARS Coronavirus 2 by RT PCR NEGATIVE NEGATIVE Final         Influenza A by PCR NEGATIVE NEGATIVE Final   Influenza B by PCR NEGATIVE NEGATIVE Final         Resp Syncytial Virus by PCR NEGATIVE NEGATIVE Final          ABX started Antibiotics Given (last 72 hours)     Date/Time Action Medication Dose Rate   12/09/24 2314 New Bag/Given   metroNIDAZOLE  (FLAGYL ) IVPB 500 mg 500 mg 100 mL/hr   12/09/24 2315 New Bag/Given   ceFEPIme  (MAXIPIME ) 2 g in sodium chloride  0.9 % 100 mL IVPB 2 g 200 mL/hr        No results found for the last 90 days.    _______________________________________________ Recent Labs  Lab 12/09/24 2123  NA 139  K 2.9*  CO2 23  GLUCOSE 172*  BUN 23  CREATININE 0.96  CALCIUM  10.7*  MG 2.3    Cr   stable,    Lab Results  Component Value Date   CREATININE 0.96 12/09/2024    Recent Labs  Lab 12/09/24 2123  AST 29  ALT 14  ALKPHOS 54  BILITOT 0.8  PROT 6.8  ALBUMIN 4.4   Lab Results  Component Value Date   CALCIUM  10.7 (H) 12/09/2024    Plt: Lab Results  Component Value Date   PLT 308 12/09/2024       Recent Labs  Lab 12/09/24 2123  WBC 10.8*  NEUTROABS 9.5*  HGB 12.9  HCT 36.8  MCV 87.8  PLT 308    HG/HCT  stable,       Component Value Date/Time   HGB 12.9 12/09/2024 2123   HCT 36.8 12/09/2024 2123   MCV 87.8 12/09/2024 2123    Recent Labs  Lab 12/09/24 2123  LIPASE 39   No results for input(s): AMMONIA in the last 168 hours.    _______________________________________________ Hospitalist was called for admission for  colitis and obstipation  The following Work up has been ordered so far:  Orders Placed This Encounter  Procedures   Resp panel by RT-PCR (RSV, Flu A&B, Covid) Anterior Nasal Swab   Gastrointestinal Panel by PCR , Stool   Culture, blood (routine x 2)   CT ABDOMEN PELVIS W CONTRAST   CBC with Differential   Comprehensive metabolic panel   Lipase, blood   Urinalysis, Routine w reflex  microscopic -Urine, Clean Catch   Magnesium   Care order/instruction: Please provide patient with oral hydration.   Consult to hospitalist   Enteric precautions (UV disinfection) C difficile, Norovirus   I-Stat Lactic Acid     OTHER Significant initial  Findings:  labs showing:     DM  labs:  HbA1C: No results for input(s): HGBA1C in the last 8760 hours.     CBG (last 3)  No results for input(s): GLUCAP in the last 72 hours.        Cultures: No results found for: SDES, SPECREQUEST, CULT, REPTSTATUS   Radiological Exams on Admission: CT ABDOMEN PELVIS W CONTRAST Result Date: 12/09/2024 CLINICAL DATA:  Abdomen pain nausea vomiting diarrhea EXAM: CT ABDOMEN AND PELVIS WITH CONTRAST TECHNIQUE: Multidetector CT imaging of the abdomen and pelvis was performed using the standard protocol following bolus administration of intravenous contrast. RADIATION DOSE REDUCTION: This exam was performed according to the departmental dose-optimization program which includes  automated exposure control, adjustment of the mA and/or kV according to patient size and/or use of iterative reconstruction technique. CONTRAST:  OMNIPAQUE  IOHEXOL  300 MG/ML  SOLN COMPARISON:  CT 11/01/2022 FINDINGS: Lower chest: Lung bases demonstrate no acute consolidation or effusion. Hazy dependent atelectasis at the bases. Hepatobiliary: Distended gallbladder. No calcified stones or biliary dilatation Pancreas: Unremarkable. No pancreatic ductal dilatation or surrounding inflammatory changes. Spleen: Normal in size without focal abnormality. Adrenals/Urinary Tract: Adrenal glands are normal. Kidneys show no hydronephrosis. The bladder is normal. Cyst in the left kidney, no imaging follow-up is recommended. Stomach/Bowel: The stomach is within normal limits. No dilated small bowel. Some fluid-filled small bowel in the pelvis. Mild wall thickening involving the ascending colon, hepatic flexure and proximal transverse  colon with mucosal enhancement. Marked circumferential wall thickening involving the descending and sigmoid colon, similar pattern as compared with prior exam from 2023. Large amount of stool within the colon. Negative appendix. Moderate fecal retention at the rectum Vascular/Lymphatic: Aortic atherosclerosis. No enlarged abdominal or pelvic lymph nodes. Reproductive: Uterus and bilateral adnexa are unremarkable. Other: Negative for free air. Small volume free fluid in the pelvis. Musculoskeletal: No acute or suspicious osseous abnormality IMPRESSION: 1. Marked circumferential wall thickening involving the descending and sigmoid colon, similar pattern as compared with prior exam from 2023. Mild wall thickening involving the ascending colon, hepatic flexure and proximal transverse colon with mucosal enhancement. Overall findings are suspected to represent colitis of infectious or inflammatory etiology. However given similar distribution of findings at the left colon as compared with the exam from 2023, advise colonoscopy follow-up after resolution of acute symptoms, if not already performed. 2. Large amount of stool within the colon with moderate fecal retention at the rectum. 3. Aortic atherosclerosis. Aortic Atherosclerosis (ICD10-I70.0). Electronically Signed   By: Luke Bun M.D.   On: 12/09/2024 23:53   _______________________________________________________________________________________________________ Latest  Blood pressure 124/64, pulse 66, temperature (!) 97.4 F (36.3 C), temperature source Oral, resp. rate 14, height 5' 5 (1.651 m), weight 56.7 kg, SpO2 100%.   Vitals  labs and radiology finding personally reviewed  Review of Systems:    Pertinent positives include:   abdominal pain, nausea, vomiting, diarrhea, Constitutional:  No weight loss, night sweats, Fevers, chills, fatigue, weight loss  HEENT:  No headaches, Difficulty swallowing,Tooth/dental problems,Sore throat,  No sneezing,  itching, ear ache, nasal congestion, post nasal drip,  Cardio-vascular:  No chest pain, Orthopnea, PND, anasarca, dizziness, palpitations.no Bilateral lower extremity swelling  GI:  No heartburn, indigestion,  change in bowel habits, loss of appetite, melena, blood in stool, hematemesis Resp:  no shortness of breath at rest. No dyspnea on exertion, No excess mucus, no productive cough, No non-productive cough, No coughing up of blood.No change in color of mucus.No wheezing. Skin:  no rash or lesions. No jaundice GU:  no dysuria, change in color of urine, no urgency or frequency. No straining to urinate.  No flank pain.  Musculoskeletal:  No joint pain or no joint swelling. No decreased range of motion. No back pain.  Psych:  No change in mood or affect. No depression or anxiety. No memory loss.  Neuro: no localizing neurological complaints, no tingling, no weakness, no double vision, no gait abnormality, no slurred speech, no confusion  All systems reviewed and apart from HOPI all are negative _______________________________________________________________________________________________ Past Medical History:  History reviewed. No pertinent past medical history.    History reviewed. No pertinent surgical history.  Social History:  Ambulatory   independently  reports that she has never smoked. She has never used smokeless tobacco. She reports current alcohol use. No history on file for drug use.     Family History:   History reviewed. No pertinent family history. ______________________________________________________________________________________________ Allergies: Allergies[1] penicilin  Prior to Admission medications  Not on File    ___________________________________________________________________________________________________ Physical Exam:    12/10/2024   12:00 AM 12/09/2024   11:45 PM 12/09/2024   11:30 PM  Vitals with BMI  Systolic 124 115 877   Diastolic 64 56 54  Pulse 66 63 65     1. General:  in No  Acute distress   Chronically ill   -appearing 2. Psychological: Alert and   Oriented 3. Head/ENT:    Dry Mucous Membranes                          Head Non traumatic, neck supple                           Poor Dentition 4. SKIN:  decreased Skin turgor,  Skin clean Dry and intact no rash    5. Heart: Regular rate and rhythm no  Murmur, no Rub or gallop 6. Lungs:   no wheezes or crackles   7. Abdomen: Soft,  -tender, Non distended   obese  bowel sounds present 8. Lower extremities: no clubbing, cyanosis, no  edema 9. Neurologically Grossly intact, moving all 4 extremities equally   10. MSK: Normal range of motion    Chart has been reviewed  ______________________________________________________________________________________________  Assessment/Plan 78 y.o. female with medical history significant of HTN, HLD, hypothyrodism  Admitted for  colitis and obstipation   Present on Admission:  Colitis  Dehydration  Hypokalemia  Hyperlipidemia  Hypothyroidism  Essential hypertension  Lactic acidosis  Constipation     Colitis Started on cefepime  flagyl  Will rehydrate Once stable may benefit from colonoscopy to further evaluate recurrent etiologies  Dehydration Rehydrate and follow  Hypokalemia Replace and recheck check magnesium level  Hyperlipidemia Check CK given dehydration if elevated hold statin  Hypothyroidism Check TSH and resume Synthroid   Essential hypertension Allow permissive hypertension  Lactic acidosis In the setting of dehydration will rehydrate with IV fluids and follow  Constipation Patient endorsing diarrhea but CT scan is actually worrisome for due to retained stool patient may be stooling around fecal retention in the rectum  Can attempt to use suppository  May need enema if does not help  Will notify GI that patient has been admitted given severity of colitis and abdominal  pain  Other plan as per orders.  DVT prophylaxis:  SCD      Code Status:    Code Status: Not on file FULL CODE   as per patient   I had personally discussed CODE STATUS with patient   ACP   none    Family Communication:   Family not at  Bedside    Diet clear   Disposition Plan:      To home once workup is complete and patient is stable   Following barriers for discharge:                                                        Electrolytes corrected  Pain controlled with PO medications                               Afebrile, white count improving able to transition to PO antibiotics                             Will need to be able to tolerate PO                              Consult Orders  (From admission, onward)           Start     Ordered   12/10/24 0028  Consult to hospitalist  Once       Provider:  (Not yet assigned)  Question Answer Comment  Place call to: [facility]   Reason for Consult Admit      12/10/24 0027           Would benefit from PT/OT eval prior to DC  Ordered                                       Consults called:    Message to [facility]     Admission status:  ED Disposition     ED Disposition  Admit   Condition  --   Comment  Hospital Area: [facility] [[id number]]  Level of Care: Progressive [102]  Admit to Progressive based on following criteria: MULTISYSTEM THREATS such as stable sepsis, metabolic/electrolyte imbalance with or without encephalopathy that is responding to early treatment.  May admit patient to [facility] or [facility] if equivalent level of care is available:: No  Diagnosis: Colitis [[id number]]  Admitting Physician: Tanajah Boulter [[id number]]  Attending Physician: Kema Santaella [[id number]]  Certification:: I certify this patient will need inpatient services for at least 2 midnights  Expected Medical Readiness: 12/12/2024             inpatient     I Expect 2 midnight stay secondary to severity of patient's current illness need for inpatient interventions justified by the following:  hemodynamic instability despite optimal treatment ( hypotension  )   Severe lab/radiological/exam abnormalities including:   Colitis    That are currently affecting medical management.   I expect  patient to be hospitalized for 2 midnights requiring inpatient medical care.  Patient is at high risk for adverse outcome (such as loss of life or disability) if not treated.  Indication for inpatient stay as follows:    Hemodynamic instability despite maximal medical therapy,  severe pain requiring acute inpatient management,  inability to maintain oral hydration    Need for operative/procedural  intervention    Need for IV antibiotics, IV fluids,, IV pain medications,     Level of care     tele  For 12H       Aylan Bayona 12/10/2024, 1:33 AM    [facility]     after 2 AM please page floor coverage   If 7AM-7PM, please contact the day team taking care of the patient using [email or web address]        [1]  Allergies Allergen Reactions   Penicillins Rash   "

## 2024-12-11 DIAGNOSIS — A0811 Acute gastroenteropathy due to Norwalk agent: Secondary | ICD-10-CM | POA: Diagnosis not present

## 2024-12-11 DIAGNOSIS — E876 Hypokalemia: Secondary | ICD-10-CM | POA: Diagnosis not present

## 2024-12-11 DIAGNOSIS — K529 Noninfective gastroenteritis and colitis, unspecified: Secondary | ICD-10-CM | POA: Diagnosis not present

## 2024-12-11 LAB — GASTROINTESTINAL PANEL BY PCR, STOOL (REPLACES STOOL CULTURE)

## 2024-12-11 LAB — CBC
HCT: 32 % — ABNORMAL LOW (ref 36.0–46.0)
Hemoglobin: 10.9 g/dL — ABNORMAL LOW (ref 12.0–15.0)
MCH: 30.6 pg (ref 26.0–34.0)
MCHC: 34.1 g/dL (ref 30.0–36.0)
MCV: 89.9 fL (ref 80.0–100.0)
Platelets: 241 K/uL (ref 150–400)
RBC: 3.56 MIL/uL — ABNORMAL LOW (ref 3.87–5.11)
RDW: 13 % (ref 11.5–15.5)
WBC: 6.2 K/uL (ref 4.0–10.5)
nRBC: 0 % (ref 0.0–0.2)

## 2024-12-11 LAB — MAGNESIUM: Magnesium: 1.9 mg/dL (ref 1.7–2.4)

## 2024-12-11 LAB — COMPREHENSIVE METABOLIC PANEL WITH GFR
ALT: 10 U/L (ref 0–44)
AST: 22 U/L (ref 15–41)
Albumin: 3.2 g/dL — ABNORMAL LOW (ref 3.5–5.0)
Alkaline Phosphatase: 43 U/L (ref 38–126)
Anion gap: 8 (ref 5–15)
BUN: 6 mg/dL — ABNORMAL LOW (ref 8–23)
CO2: 22 mmol/L (ref 22–32)
Calcium: 8.3 mg/dL — ABNORMAL LOW (ref 8.9–10.3)
Chloride: 112 mmol/L — ABNORMAL HIGH (ref 98–111)
Creatinine, Ser: 0.67 mg/dL (ref 0.44–1.00)
GFR, Estimated: >60 mL/min
Glucose, Bld: 109 mg/dL — ABNORMAL HIGH (ref 70–99)
Potassium: 3 mmol/L — ABNORMAL LOW (ref 3.5–5.1)
Sodium: 142 mmol/L (ref 135–145)
Total Bilirubin: 0.7 mg/dL (ref 0.0–1.2)
Total Protein: 5.5 g/dL — ABNORMAL LOW (ref 6.5–8.1)

## 2024-12-11 LAB — T3: T3, Total: 79 ng/dL (ref 71–180)

## 2024-12-11 MED ADMIN — Nutritional Supplement Liquid: 1 | ORAL | NDC 4390018666

## 2024-12-11 MED ADMIN — Nutritional Supplement Liquid: 237 mL | ORAL | NDC 4390018666

## 2024-12-11 MED ADMIN — Potassium Chloride Microencapsulated Crys ER Tab 20 mEq: 40 meq | ORAL | NDC 00245531911

## 2024-12-11 MED ADMIN — Atorvastatin Calcium Tab 10 MG (Base Equivalent): 10 mg | ORAL | NDC 00904629061

## 2024-12-11 NOTE — Progress Notes (Signed)
 Subjective: Patient states she had several bowel movements with colonic prep.  There is decrease in nausea, vomiting and abdominal pain.  She is requesting for diet to be advanced.  Objective: Vital signs in last 24 hours: Temp:  [97.5 F (36.4 C)-99.1 F (37.3 C)] 98.5 F (36.9 C) (12/23 0540) Pulse Rate:  [59-72] 71 (12/23 0540) Resp:  [17-19] 19 (12/23 0540) BP: (105-115)/(60-75) 115/75 (12/23 0540) SpO2:  [98 %-100 %] 98 % (12/23 0540) Weight change:  Last BM Date : 12/10/24  PE: Not in distress GENERAL:Mild pallor ABDOMEN: Nondistended, nontender EXTREMITIES: No deformity  Lab Results: Results for orders placed or performed during the hospital encounter of 12/09/24 (from the past 48 hours)  CBC with Differential     Status: Abnormal   Collection Time: 12/09/24  9:23 PM  Result Value Ref Range   WBC 10.8 (H) 4.0 - 10.5 K/uL   RBC 4.19 3.87 - 5.11 MIL/uL   Hemoglobin 12.9 12.0 - 15.0 g/dL   HCT 63.1 63.9 - 53.9 %   MCV 87.8 80.0 - 100.0 fL   MCH 30.8 26.0 - 34.0 pg   MCHC 35.1 30.0 - 36.0 g/dL   RDW 87.5 88.4 - 84.4 %   Platelets 308 150 - 400 K/uL   nRBC 0.0 0.0 - 0.2 %   Neutrophils Relative % 89 %   Neutro Abs 9.5 (H) 1.7 - 7.7 K/uL   Lymphocytes Relative 8 %   Lymphs Abs 0.8 0.7 - 4.0 K/uL   Monocytes Relative 3 %   Monocytes Absolute 0.3 0.1 - 1.0 K/uL   Eosinophils Relative 0 %   Eosinophils Absolute 0.0 0.0 - 0.5 K/uL   Basophils Relative 0 %   Basophils Absolute 0.0 0.0 - 0.1 K/uL   Immature Granulocytes 0 %   Abs Immature Granulocytes 0.03 0.00 - 0.07 K/uL    Comment: Performed at [facility], [address] [place], [place] [address]  Comprehensive metabolic panel     Status: Abnormal   Collection Time: 12/09/24  9:23 PM  Result Value Ref Range   Sodium 139 135 - 145 mmol/L   Potassium 2.9 (L) 3.5 - 5.1 mmol/L   Chloride 103 98 - 111 mmol/L   CO2 23 22 - 32 mmol/L   Glucose, Bld 172 (H) 70 - 99 mg/dL    Comment: Glucose  reference range applies only to samples taken after fasting for at least 8 hours.   BUN 23 8 - 23 mg/dL   Creatinine, Ser 9.03 0.44 - 1.00 mg/dL   Calcium  10.7 (H) 8.9 - 10.3 mg/dL   Total Protein 6.8 6.5 - 8.1 g/dL   Albumin 4.4 3.5 - 5.0 g/dL   AST 29 15 - 41 U/L   ALT 14 0 - 44 U/L   Alkaline Phosphatase 54 38 - 126 U/L   Total Bilirubin 0.8 0.0 - 1.2 mg/dL   GFR, Estimated >39 >39 mL/min    Comment: (NOTE) Calculated using the CKD-EPI Creatinine Equation (2021)    Anion gap 14 5 - 15    Comment: Performed at [facility], [address] [place], [place] [address]  Lipase, blood     Status: None   Collection Time: 12/09/24  9:23 PM  Result Value Ref Range   Lipase 39 11 - 51 U/L    Comment: Performed at [facility], [address], [place], [place] [address]  Resp panel by RT-PCR (RSV, Flu A&B, Covid) Anterior  Nasal Swab     Status: None   Collection Time: 12/09/24  9:23 PM   Specimen: Anterior Nasal Swab  Result Value Ref Range   SARS Coronavirus 2 by RT PCR NEGATIVE NEGATIVE    Comment: (NOTE) SARS-CoV-2 target nucleic acids are NOT DETECTED.  The SARS-CoV-2 RNA is generally detectable in upper respiratory specimens during the acute phase of infection. The lowest concentration of SARS-CoV-2 viral copies this assay can detect is 138 copies/mL. A negative result does not preclude SARS-Cov-2 infection and should not be used as the sole basis for treatment or other patient management decisions. A negative result may occur with  improper specimen collection/handling, submission of specimen other than nasopharyngeal swab, presence of viral mutation(s) within the areas targeted by this assay, and inadequate number of viral copies(<138 copies/mL). A negative result must be combined with clinical observations, patient history, and epidemiological information. The expected result is Negative.  Fact Sheet for Patients:   bloggercourse.com  Fact Sheet for Healthcare Providers:  seriousbroker.it  This test is no t yet approved or cleared by the [place]  FDA and  has been authorized for detection and/or diagnosis of SARS-CoV-2 by FDA under an Emergency Use Authorization (EUA). This EUA will remain  in effect (meaning this test can be used) for the duration of the COVID-19 declaration under Section 564(b)(1) of the Act, 21 U.S.C.section 360bbb-3(b)(1), unless the authorization is terminated  or revoked sooner.       Influenza A by PCR NEGATIVE NEGATIVE   Influenza B by PCR NEGATIVE NEGATIVE    Comment: (NOTE) The Xpert Xpress SARS-CoV-2/FLU/RSV plus assay is intended as an aid in the diagnosis of influenza from Nasopharyngeal swab specimens and should not be used as a sole basis for treatment. Nasal washings and aspirates are unacceptable for Xpert Xpress SARS-CoV-2/FLU/RSV testing.  Fact Sheet for Patients: bloggercourse.com  Fact Sheet for Healthcare Providers: seriousbroker.it  This test is not yet approved or cleared by the [place]  FDA and has been authorized for detection and/or diagnosis of SARS-CoV-2 by FDA under an Emergency Use Authorization (EUA). This EUA will remain in effect (meaning this test can be used) for the duration of the COVID-19 declaration under Section 564(b)(1) of the Act, 21 U.S.C. section 360bbb-3(b)(1), unless the authorization is terminated or revoked.     Resp Syncytial Virus by PCR NEGATIVE NEGATIVE    Comment: (NOTE) Fact Sheet for Patients: bloggercourse.com  Fact Sheet for Healthcare Providers: seriousbroker.it  This test is not yet approved or cleared by the [place]  FDA and has been authorized for detection and/or diagnosis of SARS-CoV-2 by FDA under an Emergency Use Authorization (EUA).  This EUA will remain in effect (meaning this test can be used) for the duration of the COVID-19 declaration under Section 564(b)(1) of the Act, 21 U.S.C. section 360bbb-3(b)(1), unless the authorization is terminated or revoked.  Performed at [facility], [address], [place], [place] [address]   Magnesium     Status: None   Collection Time: 12/09/24  9:23 PM  Result Value Ref Range   Magnesium 2.3 1.7 - 2.4 mg/dL    Comment: Performed at [facility], [address] [place], [place] [address]  I-Stat Lactic Acid     Status: Abnormal   Collection Time: 12/09/24  9:31 PM  Result Value Ref Range   Lactic Acid, Venous 2.5 (HH) 0.5 - 1.9 mmol/L   Comment NOTIFIED PHYSICIAN   Culture, blood (routine x 2)  Status: None (Preliminary result)   Collection Time: 12/09/24 11:05 PM   Specimen: BLOOD LEFT HAND  Result Value Ref Range   Specimen Description      BLOOD LEFT HAND Performed at [facility], [address] [place], [place] [address]    Special Requests      BOTTLES DRAWN AEROBIC AND ANAEROBIC Blood Culture adequate volume Performed at [facility], [address], [place], [place] [address]    Culture      NO GROWTH 1 DAY Performed at [facility], [address], [place], [place] [address]    Report Status PENDING   Culture, blood (routine x 2)     Status: None (Preliminary result)   Collection Time: 12/09/24 11:10 PM   Specimen: BLOOD RIGHT HAND  Result Value Ref Range   Specimen Description      BLOOD RIGHT HAND Performed at [facility], [address], [place], [place] [address]    Special Requests      BOTTLES DRAWN AEROBIC AND ANAEROBIC Blood Culture adequate volume Performed at [facility], [address], [place], [place] [address]    Culture      NO GROWTH 1 DAY Performed at [facility], [address] [place], [place]  [address]    Report Status PENDING   I-Stat Lactic Acid     Status: None   Collection Time: 12/09/24 11:28 PM  Result Value Ref Range   Lactic Acid, Venous 1.8 0.5 - 1.9 mmol/L  Urinalysis, Routine w reflex microscopic -Urine, Clean Catch     Status: Abnormal   Collection Time: 12/10/24 12:17 AM  Result Value Ref Range   Color, Urine YELLOW YELLOW   APPearance CLEAR CLEAR   Specific Gravity, Urine 1.028 1.005 - 1.030   pH 6.0 5.0 - 8.0   Glucose, UA NEGATIVE NEGATIVE mg/dL   Hgb urine dipstick NEGATIVE NEGATIVE   Bilirubin Urine NEGATIVE NEGATIVE   Ketones, ur 5 (A) NEGATIVE mg/dL   Protein, ur NEGATIVE NEGATIVE mg/dL   Nitrite NEGATIVE NEGATIVE   Leukocytes,Ua TRACE (A) NEGATIVE   RBC / HPF 0-5 0 - 5 RBC/hpf   WBC, UA 0-5 0 - 5 WBC/hpf   Bacteria, UA RARE (A) NONE SEEN   Squamous Epithelial / HPF 0-5 0 - 5 /HPF    Comment: Performed at [facility], [address], [place], [place] [address]  CK     Status: None   Collection Time: 12/10/24  3:29 AM  Result Value Ref Range   Total CK 90 38 - 234 U/L    Comment: Performed at [facility], [address], [place], [place] [address]  Osmolality     Status: Abnormal   Collection Time: 12/10/24  3:29 AM  Result Value Ref Range   Osmolality 299 (H) 275 - 295 mOsm/kg    Comment: Performed at [facility], [address] [place], [place] [address]  Prealbumin     Status: None   Collection Time: 12/10/24  3:29 AM  Result Value Ref Range   Prealbumin 20 18 - 38 mg/dL    Comment: Performed at [facility], [address], [place], [place] [address]  TSH     Status: None   Collection Time: 12/10/24  3:29 AM  Result Value Ref Range   TSH 4.140 0.350 - 4.500 uIU/mL    Comment: Performed at [facility], [address] [place], [place] [address]  T4, free     Status: None   Collection Time: 12/10/24  3:29 AM  Result Value Ref Range   Free T4 1.10 0.80 - 2.00 ng/dL     Comment: Performed at [facility], [address], [place], [place] [address]  T3     Status: None   Collection Time: 12/10/24  3:29 AM  Result Value Ref Range   T3, Total 79 71 - 180 ng/dL    Comment: (NOTE) Performed At: [facility] [address] [place], [place] [id number] Jennette Shorter MD Ey:[phone]   Lactic acid, plasma     Status: None   Collection Time: 12/10/24  3:29 AM  Result Value Ref Range   Lactic Acid, Venous 1.2 0.5 - 1.9 mmol/L    Comment: Performed at [facility], [address], [place], [place] [address]  Magnesium     Status: None   Collection Time: 12/10/24  3:29 AM  Result Value Ref Range   Magnesium 2.2 1.7 - 2.4 mg/dL    Comment: Performed at [facility], [address], [place], [place] [address]  Phosphorus     Status: None   Collection Time: 12/10/24  3:29 AM  Result Value Ref Range   Phosphorus 3.1 2.5 - 4.6 mg/dL    Comment: Performed at [facility], [address], [place], [place] [address]  Comprehensive metabolic panel     Status: Abnormal   Collection Time: 12/10/24  3:29 AM  Result Value Ref Range   Sodium 138 135 - 145 mmol/L   Potassium 3.6 3.5 - 5.1 mmol/L    Comment: Delta check noted    Chloride 107 98 - 111 mmol/L   CO2 20 (L) 22 - 32 mmol/L   Glucose, Bld 161 (H) 70 - 99 mg/dL    Comment: Glucose reference range applies only to samples taken after fasting for at least 8 hours.   BUN 20 8 - 23 mg/dL   Creatinine, Ser 9.16 0.44 - 1.00 mg/dL   Calcium  9.0 8.9 - 10.3 mg/dL   Total Protein 5.9 (L) 6.5 - 8.1 g/dL   Albumin 3.8 3.5 - 5.0 g/dL   AST 23 15 - 41 U/L   ALT 13 0 - 44 U/L   Alkaline Phosphatase 45 38 - 126 U/L   Total Bilirubin 0.6 0.0 - 1.2 mg/dL   GFR, Estimated >39 >39 mL/min    Comment: (NOTE) Calculated using the CKD-EPI Creatinine Equation (2021)    Anion gap 11 5 - 15    Comment: Performed at [facility], [address], [place], [place] [address]  CBC     Status: Abnormal   Collection Time: 12/10/24  3:29 AM  Result Value Ref Range   WBC 8.6 4.0 - 10.5 K/uL   RBC 3.67 (L) 3.87 - 5.11 MIL/uL   Hemoglobin 11.4 (L) 12.0 - 15.0 g/dL   HCT 67.4 (L) 63.9 - 53.9 %   MCV 88.6 80.0 - 100.0 fL   MCH 31.1 26.0 - 34.0 pg   MCHC 35.1 30.0 - 36.0 g/dL   RDW 87.1 88.4 - 84.4 %   Platelets 253 150 - 400 K/uL   nRBC 0.0 0.0 - 0.2 %    Comment: Performed at [facility], [address], [place], [place] [address]  Lactic acid, plasma     Status: Abnormal   Collection  Time: 12/10/24  6:25 AM  Result Value Ref Range   Lactic Acid, Venous 2.5 (HH) 0.5 - 1.9 mmol/L    Comment: Critical Value, Read Back and verified with DARK, T RN AT [phone] ON 12/10/2024 BY PRUDY POUR  Performed at [facility], [address], [place], [place] [address]   Creatinine, urine, random     Status: None   Collection Time: 12/10/24  9:00 AM  Result Value Ref Range   Creatinine, Urine 22 mg/dL    Comment: NO NORMAL RANGE ESTABLISHED FOR THIS TEST Performed at [facility], [address] [place], [place] [address]   Osmolality, urine     Status: None   Collection Time: 12/10/24  9:00 AM  Result Value Ref Range   Osmolality, Ur 393 300 - 900 mOsm/kg    Comment: Performed at [facility], [address] [place], [place] [address]  Sodium, urine, random     Status: None   Collection Time: 12/10/24  9:00 AM  Result Value Ref Range   Sodium, Ur 91 mmol/L    Comment: NO NORMAL RANGE ESTABLISHED FOR THIS TEST Performed at [facility], [address] [place], [place] [address]   Gastrointestinal Panel by PCR , Stool     Status: Abnormal   Collection Time: 12/10/24  9:39 AM   Specimen: Stool  Result Value Ref Range   Campylobacter species NOT DETECTED NOT DETECTED   Plesimonas shigelloides NOT DETECTED NOT DETECTED   Salmonella species NOT DETECTED  NOT DETECTED   Yersinia enterocolitica NOT DETECTED NOT DETECTED   Vibrio species NOT DETECTED NOT DETECTED   Vibrio cholerae NOT DETECTED NOT DETECTED   Enteroaggregative E coli (EAEC) NOT DETECTED NOT DETECTED   Enteropathogenic E coli (EPEC) NOT DETECTED NOT DETECTED   Enterotoxigenic E coli (ETEC) NOT DETECTED NOT DETECTED   Shiga like toxin producing E coli (STEC) NOT DETECTED NOT DETECTED   Shigella/Enteroinvasive E coli (EIEC) NOT DETECTED NOT DETECTED   Cryptosporidium NOT DETECTED NOT DETECTED   Cyclospora cayetanensis NOT DETECTED NOT DETECTED   Entamoeba histolytica NOT DETECTED NOT DETECTED   Giardia lamblia NOT DETECTED NOT DETECTED   Adenovirus F40/41 NOT DETECTED NOT DETECTED   Astrovirus NOT DETECTED NOT DETECTED   Norovirus GI/GII DETECTED (A) NOT DETECTED    Comment: RESULT CALLED TO, READ BACK BY AND VERIFIED WITH:  VANESSA NAVARRO AT 2359 12/10/24 JG    Rotavirus A NOT DETECTED NOT DETECTED   Sapovirus (I, II, IV, and V) NOT DETECTED NOT DETECTED    Comment: Performed at [facility], [address], [place], [place] [address]  C Difficile Quick Screen w PCR reflex     Status: None   Collection Time: 12/10/24  9:39 AM   Specimen: Stool  Result Value Ref Range   C Diff antigen NEGATIVE NEGATIVE   C Diff toxin NEGATIVE NEGATIVE   C Diff interpretation No C. difficile detected.     Comment: Performed at [facility], [address] [place], [place] [address]  CBC     Status: Abnormal   Collection Time: 12/11/24  7:50 AM  Result Value Ref Range   WBC 6.2 4.0 - 10.5 K/uL   RBC 3.56 (L) 3.87 - 5.11 MIL/uL   Hemoglobin 10.9 (L) 12.0 - 15.0 g/dL   HCT 67.9 (L) 63.9 - 53.9 %   MCV 89.9 80.0 - 100.0 fL   MCH 30.6 26.0 - 34.0 pg  MCHC 34.1 30.0 - 36.0 g/dL   RDW 86.9 88.4 - 84.4 %   Platelets 241 150 - 400 K/uL   nRBC 0.0 0.0 - 0.2 %    Comment: Performed at [facility], [address], [place], [place] [address]   Comprehensive metabolic panel with GFR     Status: Abnormal   Collection Time: 12/11/24  7:50 AM  Result Value Ref Range   Sodium 142 135 - 145 mmol/L   Potassium 3.0 (L) 3.5 - 5.1 mmol/L   Chloride 112 (H) 98 - 111 mmol/L   CO2 22 22 - 32 mmol/L   Glucose, Bld 109 (H) 70 - 99 mg/dL    Comment: Glucose reference range applies only to samples taken after fasting for at least 8 hours.   BUN 6 (L) 8 - 23 mg/dL   Creatinine, Ser 9.32 0.44 - 1.00 mg/dL   Calcium  8.3 (L) 8.9 - 10.3 mg/dL   Total Protein 5.5 (L) 6.5 - 8.1 g/dL   Albumin 3.2 (L) 3.5 - 5.0 g/dL   AST 22 15 - 41 U/L   ALT 10 0 - 44 U/L   Alkaline Phosphatase 43 38 - 126 U/L   Total Bilirubin 0.7 0.0 - 1.2 mg/dL   GFR, Estimated >39 >39 mL/min    Comment: (NOTE) Calculated using the CKD-EPI Creatinine Equation (2021)    Anion gap 8 5 - 15    Comment: Performed at [facility], [address], [place], [place] [address]  Magnesium     Status: None   Collection Time: 12/11/24  7:50 AM  Result Value Ref Range   Magnesium 1.9 1.7 - 2.4 mg/dL    Comment: Performed at [facility], [address], [place], [place] [address]    Studies/Results: CT ABDOMEN PELVIS W CONTRAST Result Date: 12/09/2024 CLINICAL DATA:  Abdomen pain nausea vomiting diarrhea EXAM: CT ABDOMEN AND PELVIS WITH CONTRAST TECHNIQUE: Multidetector CT imaging of the abdomen and pelvis was performed using the standard protocol following bolus administration of intravenous contrast. RADIATION DOSE REDUCTION: This exam was performed according to the departmental dose-optimization program which includes automated exposure control, adjustment of the mA and/or kV according to patient size and/or use of iterative reconstruction technique. CONTRAST:  OMNIPAQUE  IOHEXOL  300 MG/ML  SOLN COMPARISON:  CT 11/01/2022 FINDINGS: Lower chest: Lung bases demonstrate no acute consolidation or effusion. Hazy dependent atelectasis at the  bases. Hepatobiliary: Distended gallbladder. No calcified stones or biliary dilatation Pancreas: Unremarkable. No pancreatic ductal dilatation or surrounding inflammatory changes. Spleen: Normal in size without focal abnormality. Adrenals/Urinary Tract: Adrenal glands are normal. Kidneys show no hydronephrosis. The bladder is normal. Cyst in the left kidney, no imaging follow-up is recommended. Stomach/Bowel: The stomach is within normal limits. No dilated small bowel. Some fluid-filled small bowel in the pelvis. Mild wall thickening involving the ascending colon, hepatic flexure and proximal transverse colon with mucosal enhancement. Marked circumferential wall thickening involving the descending and sigmoid colon, similar pattern as compared with prior exam from 2023. Large amount of stool within the colon. Negative appendix. Moderate fecal retention at the rectum Vascular/Lymphatic: Aortic atherosclerosis. No enlarged abdominal or pelvic lymph nodes. Reproductive: Uterus and bilateral adnexa are unremarkable. Other: Negative for free air. Small volume free fluid in the pelvis. Musculoskeletal: No acute or suspicious osseous abnormality IMPRESSION: 1. Marked circumferential wall thickening involving the descending and sigmoid colon, similar pattern as compared with prior exam from 2023. Mild wall thickening involving the  ascending colon, hepatic flexure and proximal transverse colon with mucosal enhancement. Overall findings are suspected to represent colitis of infectious or inflammatory etiology. However given similar distribution of findings at the left colon as compared with the exam from 2023, advise colonoscopy follow-up after resolution of acute symptoms, if not already performed. 2. Large amount of stool within the colon with moderate fecal retention at the rectum. 3. Aortic atherosclerosis. Aortic Atherosclerosis (ICD10-I70.0). Electronically Signed   By: Luke Bun M.D.   On: 12/09/2024 23:53     Medications: I have reviewed the patient's current medications.  Assessment: Colitis GI pathogen panel positive for norovirus Large amount of stool noted within colon with moderate fecal retention in rectum, tolerated Nulytely colonic prep  Mild hypokalemia, potassium 40 mEq every 4 hours ordered for 2 doses Mild malnutrition, total protein 5.5, albumin 3.2 Mild anemia, hemoglobin 10.9   Plan: Will change diet to regular. Okay to DC IV cefepime  and IV Flagyl . Recommend using MiraLAX 17 g 1-2 times a day as needed for constipation as an outpatient. Norovirus colitis is usually self-limited. Okay to DC patient home from GI standpoint.  Estelita Manas, MD 12/11/2024, 10:42 AM

## 2024-12-11 NOTE — Progress Notes (Signed)
 Mobility Specialist - Progress Note   12/11/24 1117  Mobility  Activity Ambulated independently  Level of Assistance Independent after set-up  Assistive Device None  Distance Ambulated (ft) 100 ft  Activity Response Tolerated well  Mobility Referral Yes  Mobility visit 1 Mobility  Mobility Specialist Start Time (ACUTE ONLY) 1033  Mobility Specialist Stop Time (ACUTE ONLY) 1047  Mobility Specialist Time Calculation (min) (ACUTE ONLY) 14 min   Pt agreeable to mobilize this morning. No complaints made. Pt returned to room to use bathroom and was then left in bed with call light at side.   Alfornia EDISON Mobility Specialist Acute Rehabilitation Services 12/11/2024, 11:19 AM

## 2024-12-11 NOTE — Progress Notes (Signed)
 "  [facility] PROGRESS NOTE   Yolanda Webb FMW:[id number] DOB: 1946/11/01 DOA: 12/09/2024  PCP: Cleotilde Planas, MD  Brief History: 78 y.o. female with medical history significant of HTN, HLD, hypothyrodism who presented with nausea vomiting abdominal pain.  Also had diarrhea.  Imaging studies raise concern for colitis.  Patient was hospitalized for further management.   Consultants: Gastroenterology  Procedures: None yet   Subjective/Interval History: Patient mentioned that she had several bowel movements yesterday and overnight.  Feels better this morning.  Less abdominal pain.  No nausea vomiting.     Assessment/Plan:  Acute Colitis/norovirus infection Patient mentioned that she has had loose stools.  CT scan however showed a lot of stool in her colon.  Concern also raised for moderate fecal retention in the rectum.    She reports an unremarkable colonoscopy within the last 1 to 2 years.  CT scan also showed evidence for colitis. GI pathogen panel was ordered.  C. difficile has been added.  She was empirically on cefepime  and metronidazole  Patient is seen by gastroenterology.  She was given laxatives. GI pathogen panel was positive for norovirus which could be the reason for her presentation.  C. difficile was negative. Antibiotics will be discontinued.  Diet will be advanced.  No further interventions per gastroenterology.  Start mobilizing.  Hypokalemia Continue to supplement.  Normocytic anemia No evidence of overt bleeding.  Hypothyroidism Continue levothyroxine .  TSH and free T4 levels are normal.  Essential hypertension Borderline low blood pressures noted.  Not on any blood pressure lowering agents currently.  DVT Prophylaxis: SCDs for now Code Status: Full code Family Communication: Discussed with patient Disposition Plan: Home when improved    Medications: Scheduled:  feeding supplement  1 Container Oral TID BM   fentaNYL (SUBLIMAZE) injection  50 mcg  Intravenous Once   potassium chloride   40 mEq Oral Q4H   Continuous:   PRN:acetaminophen  **OR** acetaminophen , fentaNYL (SUBLIMAZE) injection, HYDROcodone-acetaminophen , ondansetron  **OR** ondansetron  (ZOFRAN ) IV, mouth rinse  Objective:  Vital Signs  Vitals:   12/10/24 1744 12/10/24 2041 12/10/24 2220 12/11/24 0540  BP: 105/62 108/60  115/75  Pulse: 72 70  71  Resp: 17 18 18 19   Temp: 98.2 F (36.8 C) 99.1 F (37.3 C)  98.5 F (36.9 C)  TempSrc: Oral Oral  Oral  SpO2: 100% 98%  98%  Weight:      Height:        Intake/Output Summary (Last 24 hours) at 12/11/2024 1105 Last data filed at 12/11/2024 1021 Gross per 24 hour  Intake 2720.53 ml  Output --  Net 2720.53 ml   Filed Weights   12/09/24 2113 12/10/24 0229  Weight: 56.7 kg 59.2 kg    General appearance: Awake alert.  In no distress Resp: Clear to auscultation bilaterally.  Normal effort Cardio: S1-S2 is normal regular.  No S3-S4.  No rubs murmurs or bruit GI: Abdomen is soft.  Less tender today compared to yesterday.  Bowel sounds are present.  No masses organomegaly.   Extremities: No edema.  Full range of motion of lower extremities. Neurologic: Alert and oriented x3.  No focal neurological deficits.    Lab Results:  Data Reviewed: I have personally reviewed following labs and reports of the imaging studies  CBC: Recent Labs  Lab 12/09/24 2123 12/10/24 0329 12/11/24 0750  WBC 10.8* 8.6 6.2  NEUTROABS 9.5*  --   --   HGB 12.9 11.4* 10.9*  HCT 36.8 32.5* 32.0*  MCV 87.8 88.6 89.9  PLT 308 253 241    Basic Metabolic Panel: Recent Labs  Lab 12/09/24 2123 12/10/24 0329 12/11/24 0750  NA 139 138 142  K 2.9* 3.6 3.0*  CL 103 107 112*  CO2 23 20* 22  GLUCOSE 172* 161* 109*  BUN 23 20 6*  CREATININE 0.96 0.83 0.67  CALCIUM  10.7* 9.0 8.3*  MG 2.3 2.2 1.9  PHOS  --  3.1  --     GFR: Estimated Creatinine Clearance: 53.8 mL/min (by C-G formula based on SCr of 0.67 mg/dL).  Liver Function  Tests: Recent Labs  Lab 12/09/24 2123 12/10/24 0329 12/11/24 0750  AST 29 23 22   ALT 14 13 10   ALKPHOS 54 45 43  BILITOT 0.8 0.6 0.7  PROT 6.8 5.9* 5.5*  ALBUMIN 4.4 3.8 3.2*    Recent Labs  Lab 12/09/24 2123  LIPASE 39    Cardiac Enzymes: Recent Labs  Lab 12/10/24 0329  CKTOTAL 90    Thyroid Function Tests: Recent Labs    12/10/24 0329  TSH 4.140  FREET4 1.10    Recent Results (from the past 240 hours)  Resp panel by RT-PCR (RSV, Flu A&B, Covid) Anterior Nasal Swab     Status: None   Collection Time: 12/09/24  9:23 PM   Specimen: Anterior Nasal Swab  Result Value Ref Range Status   SARS Coronavirus 2 by RT PCR NEGATIVE NEGATIVE Final    Comment: (NOTE) SARS-CoV-2 target nucleic acids are NOT DETECTED.  The SARS-CoV-2 RNA is generally detectable in upper respiratory specimens during the acute phase of infection. The lowest concentration of SARS-CoV-2 viral copies this assay can detect is 138 copies/mL. A negative result does not preclude SARS-Cov-2 infection and should not be used as the sole basis for treatment or other patient management decisions. A negative result may occur with  improper specimen collection/handling, submission of specimen other than nasopharyngeal swab, presence of viral mutation(s) within the areas targeted by this assay, and inadequate number of viral copies(<138 copies/mL). A negative result must be combined with clinical observations, patient history, and epidemiological information. The expected result is Negative.  Fact Sheet for Patients:  bloggercourse.com  Fact Sheet for Healthcare Providers:  seriousbroker.it  This test is no t yet approved or cleared by the [place]  FDA and  has been authorized for detection and/or diagnosis of SARS-CoV-2 by FDA under an Emergency Use Authorization (EUA). This EUA will remain  in effect (meaning this test can be used) for the  duration of the COVID-19 declaration under Section 564(b)(1) of the Act, 21 U.S.C.section 360bbb-3(b)(1), unless the authorization is terminated  or revoked sooner.       Influenza A by PCR NEGATIVE NEGATIVE Final   Influenza B by PCR NEGATIVE NEGATIVE Final    Comment: (NOTE) The Xpert Xpress SARS-CoV-2/FLU/RSV plus assay is intended as an aid in the diagnosis of influenza from Nasopharyngeal swab specimens and should not be used as a sole basis for treatment. Nasal washings and aspirates are unacceptable for Xpert Xpress SARS-CoV-2/FLU/RSV testing.  Fact Sheet for Patients: bloggercourse.com  Fact Sheet for Healthcare Providers: seriousbroker.it  This test is not yet approved or cleared by the [place]  FDA and has been authorized for detection and/or diagnosis of SARS-CoV-2 by FDA under an Emergency Use Authorization (EUA). This EUA will remain in effect (meaning this test can be used) for the duration of the COVID-19 declaration under Section 564(b)(1) of the Act, 21 U.S.C. section 360bbb-3(b)(1), unless the authorization is terminated or revoked.  Resp Syncytial Virus by PCR NEGATIVE NEGATIVE Final    Comment: (NOTE) Fact Sheet for Patients: bloggercourse.com  Fact Sheet for Healthcare Providers: seriousbroker.it  This test is not yet approved or cleared by the [place]  FDA and has been authorized for detection and/or diagnosis of SARS-CoV-2 by FDA under an Emergency Use Authorization (EUA). This EUA will remain in effect (meaning this test can be used) for the duration of the COVID-19 declaration under Section 564(b)(1) of the Act, 21 U.S.C. section 360bbb-3(b)(1), unless the authorization is terminated or revoked.  Performed at [facility], [address] [place], [place] [address]   Culture, blood (routine x 2)     Status: None  (Preliminary result)   Collection Time: 12/09/24 11:05 PM   Specimen: BLOOD LEFT HAND  Result Value Ref Range Status   Specimen Description   Final    BLOOD LEFT HAND Performed at [facility], [address], [place], [place] [address]    Special Requests   Final    BOTTLES DRAWN AEROBIC AND ANAEROBIC Blood Culture adequate volume Performed at [facility], [address], [place], [place] [address]    Culture   Final    NO GROWTH 1 DAY Performed at [facility], [address] [place], [place] [address]    Report Status PENDING  Incomplete  Culture, blood (routine x 2)     Status: None (Preliminary result)   Collection Time: 12/09/24 11:10 PM   Specimen: BLOOD RIGHT HAND  Result Value Ref Range Status   Specimen Description   Final    BLOOD RIGHT HAND Performed at [facility], [address] [place] [address] [place], [place] [address]    Special Requests   Final    BOTTLES DRAWN AEROBIC AND ANAEROBIC Blood Culture adequate volume Performed at [facility], [address], [place], [place] [address]    Culture   Final    NO GROWTH 1 DAY Performed at [facility], [address], [place], [place] [address]    Report Status PENDING  Incomplete  Gastrointestinal Panel by PCR , Stool     Status: Abnormal   Collection Time: 12/10/24  9:39 AM   Specimen: Stool  Result Value Ref Range Status   Campylobacter species NOT DETECTED NOT DETECTED Final   Plesimonas shigelloides NOT DETECTED NOT DETECTED Final   Salmonella species NOT DETECTED NOT DETECTED Final   Yersinia enterocolitica NOT DETECTED NOT DETECTED Final   Vibrio species NOT DETECTED NOT DETECTED Final   Vibrio cholerae NOT DETECTED NOT DETECTED Final   Enteroaggregative E coli (EAEC) NOT DETECTED NOT DETECTED Final   Enteropathogenic E coli (EPEC) NOT DETECTED NOT DETECTED Final   Enterotoxigenic E coli (ETEC) NOT DETECTED NOT DETECTED Final    Shiga like toxin producing E coli (STEC) NOT DETECTED NOT DETECTED Final   Shigella/Enteroinvasive E coli (EIEC) NOT DETECTED NOT DETECTED Final   Cryptosporidium NOT DETECTED NOT DETECTED Final   Cyclospora cayetanensis NOT DETECTED NOT DETECTED Final   Entamoeba histolytica NOT DETECTED NOT DETECTED Final   Giardia lamblia NOT DETECTED NOT DETECTED Final   Adenovirus F40/41 NOT DETECTED NOT DETECTED Final   Astrovirus NOT DETECTED NOT DETECTED Final   Norovirus GI/GII DETECTED (A) NOT DETECTED Final    Comment: RESULT CALLED TO, READ BACK BY AND VERIFIED WITH:  VANESSA NAVARRO AT 2359 12/10/24 JG    Rotavirus A NOT DETECTED NOT DETECTED Final  Sapovirus (I, II, IV, and V) NOT DETECTED NOT DETECTED Final    Comment: Performed at [facility], [address] [place], [place] [address]  C Difficile Quick Screen w PCR reflex     Status: None   Collection Time: 12/10/24  9:39 AM   Specimen: Stool  Result Value Ref Range Status   C Diff antigen NEGATIVE NEGATIVE Final   C Diff toxin NEGATIVE NEGATIVE Final   C Diff interpretation No C. difficile detected.  Final    Comment: Performed at [facility], [address], [place], [place] [address]      Radiology Studies: CT ABDOMEN PELVIS W CONTRAST Result Date: 12/09/2024 CLINICAL DATA:  Abdomen pain nausea vomiting diarrhea EXAM: CT ABDOMEN AND PELVIS WITH CONTRAST TECHNIQUE: Multidetector CT imaging of the abdomen and pelvis was performed using the standard protocol following bolus administration of intravenous contrast. RADIATION DOSE REDUCTION: This exam was performed according to the departmental dose-optimization program which includes automated exposure control, adjustment of the mA and/or kV according to patient size and/or use of iterative reconstruction technique. CONTRAST:  OMNIPAQUE  IOHEXOL  300 MG/ML  SOLN COMPARISON:  CT 11/01/2022 FINDINGS: Lower chest: Lung bases demonstrate no acute  consolidation or effusion. Hazy dependent atelectasis at the bases. Hepatobiliary: Distended gallbladder. No calcified stones or biliary dilatation Pancreas: Unremarkable. No pancreatic ductal dilatation or surrounding inflammatory changes. Spleen: Normal in size without focal abnormality. Adrenals/Urinary Tract: Adrenal glands are normal. Kidneys show no hydronephrosis. The bladder is normal. Cyst in the left kidney, no imaging follow-up is recommended. Stomach/Bowel: The stomach is within normal limits. No dilated small bowel. Some fluid-filled small bowel in the pelvis. Mild wall thickening involving the ascending colon, hepatic flexure and proximal transverse colon with mucosal enhancement. Marked circumferential wall thickening involving the descending and sigmoid colon, similar pattern as compared with prior exam from 2023. Large amount of stool within the colon. Negative appendix. Moderate fecal retention at the rectum Vascular/Lymphatic: Aortic atherosclerosis. No enlarged abdominal or pelvic lymph nodes. Reproductive: Uterus and bilateral adnexa are unremarkable. Other: Negative for free air. Small volume free fluid in the pelvis. Musculoskeletal: No acute or suspicious osseous abnormality IMPRESSION: 1. Marked circumferential wall thickening involving the descending and sigmoid colon, similar pattern as compared with prior exam from 2023. Mild wall thickening involving the ascending colon, hepatic flexure and proximal transverse colon with mucosal enhancement. Overall findings are suspected to represent colitis of infectious or inflammatory etiology. However given similar distribution of findings at the left colon as compared with the exam from 2023, advise colonoscopy follow-up after resolution of acute symptoms, if not already performed. 2. Large amount of stool within the colon with moderate fecal retention at the rectum. 3. Aortic atherosclerosis. Aortic Atherosclerosis (ICD10-I70.0). Electronically  Signed   By: Luke Bun M.D.   On: 12/09/2024 23:53       LOS: 1 day   Joette Pebbles  [facility] Pager on [email or web address]  12/11/2024, 11:05 AM   "

## 2024-12-12 ENCOUNTER — Other Ambulatory Visit (HOSPITAL_COMMUNITY): Payer: Self-pay

## 2024-12-12 DIAGNOSIS — A0811 Acute gastroenteropathy due to Norwalk agent: Secondary | ICD-10-CM | POA: Diagnosis not present

## 2024-12-12 LAB — BASIC METABOLIC PANEL WITH GFR
Anion gap: 7 (ref 5–15)
BUN: 7 mg/dL — ABNORMAL LOW (ref 8–23)
CO2: 23 mmol/L (ref 22–32)
Calcium: 8.9 mg/dL (ref 8.9–10.3)
Chloride: 112 mmol/L — ABNORMAL HIGH (ref 98–111)
Creatinine, Ser: 0.68 mg/dL (ref 0.44–1.00)
GFR, Estimated: >60 mL/min
Glucose, Bld: 99 mg/dL (ref 70–99)
Potassium: 3.4 mmol/L — ABNORMAL LOW (ref 3.5–5.1)
Sodium: 142 mmol/L (ref 135–145)

## 2024-12-12 MED ADMIN — Nutritional Supplement Liquid: 1 | ORAL | NDC 04390018666

## 2024-12-12 MED ADMIN — Atorvastatin Calcium Tab 10 MG (Base Equivalent): 10 mg | ORAL | NDC 50268009311

## 2024-12-12 MED ADMIN — Levothyroxine Sodium Tab 50 MCG: 50 ug | ORAL | NDC 00904695061

## 2024-12-12 MED ADMIN — Potassium Chloride Microencapsulated Crys ER Tab 20 mEq: 40 meq | ORAL | NDC 00245531989

## 2024-12-12 NOTE — Discharge Summary (Signed)
 " [facility]  Physician Discharge Summary   Patient ID: Yolanda Webb MRN: [id number] DOB/AGE: [birth year] 78 y.o.  Admit date: 12/09/2024 Discharge date: 12/12/2024    PCP: Cleotilde Planas, MD  DISCHARGE DIAGNOSES:  Norovirus infection Acute colitis   Hypokalemia   Hyperlipidemia   Hypothyroidism   Essential hypertension   Lactic acidosis   RECOMMENDATIONS FOR OUTPATIENT FOLLOW UP: Follow-up with PCP   Home Health: None Equipment/Devices: None  CODE STATUS: Full code  DISCHARGE CONDITION: fair  Diet recommendation: As before  INITIAL HISTORY: 78 y.o. female with medical history significant of HTN, HLD, hypothyrodism who presented with nausea vomiting abdominal pain.  Also had diarrhea.  Imaging studies raise concern for colitis.  Patient was hospitalized for further management.    Consultants: Gastroenterology   Procedures: None  HOSPITAL COURSE:   Acute Colitis/norovirus infection Patient mentioned that she has had loose stools.  CT scan however showed a lot of stool in her colon.  Concern also raised for moderate fecal retention in the rectum.    She reports an unremarkable colonoscopy within the last 1 to 2 years.  CT scan also showed evidence for colitis.  C. difficile was negative.  GI pathogen panel positive for norovirus which explains her is presentation.  Antibiotics were discontinued.  No further interventions per gastroenterology.  Patient started improving.  Okay for discharge today.     Hypokalemia Supplemented   Normocytic anemia No evidence of overt bleeding.   Hypothyroidism Continue levothyroxine .  TSH and free T4 levels are normal.   Essential hypertension  Patient is stable.  Improved.  Okay for discharge home today.    PERTINENT LABS:  The results of significant diagnostics from this hospitalization (including imaging, microbiology, ancillary and laboratory) are listed below for reference.    Microbiology: Recent Results  (from the past 240 hours)  Resp panel by RT-PCR (RSV, Flu A&B, Covid) Anterior Nasal Swab     Status: None   Collection Time: 12/09/24  9:23 PM   Specimen: Anterior Nasal Swab  Result Value Ref Range Status   SARS Coronavirus 2 by RT PCR NEGATIVE NEGATIVE Final    Comment: (NOTE) SARS-CoV-2 target nucleic acids are NOT DETECTED.  The SARS-CoV-2 RNA is generally detectable in upper respiratory specimens during the acute phase of infection. The lowest concentration of SARS-CoV-2 viral copies this assay can detect is 138 copies/mL. A negative result does not preclude SARS-Cov-2 infection and should not be used as the sole basis for treatment or other patient management decisions. A negative result may occur with  improper specimen collection/handling, submission of specimen other than nasopharyngeal swab, presence of viral mutation(s) within the areas targeted by this assay, and inadequate number of viral copies(<138 copies/mL). A negative result must be combined with clinical observations, patient history, and epidemiological information. The expected result is Negative.  Fact Sheet for Patients:  bloggercourse.com  Fact Sheet for Healthcare Providers:  seriousbroker.it  This test is no t yet approved or cleared by the [place]  FDA and  has been authorized for detection and/or diagnosis of SARS-CoV-2 by FDA under an Emergency Use Authorization (EUA). This EUA will remain  in effect (meaning this test can be used) for the duration of the COVID-19 declaration under Section 564(b)(1) of the Act, 21 U.S.C.section 360bbb-3(b)(1), unless the authorization is terminated  or revoked sooner.       Influenza A by PCR NEGATIVE NEGATIVE Final   Influenza B by PCR NEGATIVE NEGATIVE Final    Comment: (NOTE)  The Xpert Xpress SARS-CoV-2/FLU/RSV plus assay is intended as an aid in the diagnosis of influenza from Nasopharyngeal swab  specimens and should not be used as a sole basis for treatment. Nasal washings and aspirates are unacceptable for Xpert Xpress SARS-CoV-2/FLU/RSV testing.  Fact Sheet for Patients: bloggercourse.com  Fact Sheet for Healthcare Providers: seriousbroker.it  This test is not yet approved or cleared by the [place]  FDA and has been authorized for detection and/or diagnosis of SARS-CoV-2 by FDA under an Emergency Use Authorization (EUA). This EUA will remain in effect (meaning this test can be used) for the duration of the COVID-19 declaration under Section 564(b)(1) of the Act, 21 U.S.C. section 360bbb-3(b)(1), unless the authorization is terminated or revoked.     Resp Syncytial Virus by PCR NEGATIVE NEGATIVE Final    Comment: (NOTE) Fact Sheet for Patients: bloggercourse.com  Fact Sheet for Healthcare Providers: seriousbroker.it  This test is not yet approved or cleared by the [place]  FDA and has been authorized for detection and/or diagnosis of SARS-CoV-2 by FDA under an Emergency Use Authorization (EUA). This EUA will remain in effect (meaning this test can be used) for the duration of the COVID-19 declaration under Section 564(b)(1) of the Act, 21 U.S.C. section 360bbb-3(b)(1), unless the authorization is terminated or revoked.  Performed at [facility], [address], [place], [place] [address]   Culture, blood (routine x 2)     Status: None (Preliminary result)   Collection Time: 12/09/24 11:05 PM   Specimen: BLOOD LEFT HAND  Result Value Ref Range Status   Specimen Description   Final    BLOOD LEFT HAND Performed at [facility], [address], [place], [place] [address]    Special Requests   Final    BOTTLES DRAWN AEROBIC AND ANAEROBIC Blood Culture adequate volume Performed at [facility], [address] [place], [place] [address]    Culture   Final    NO GROWTH 2 DAYS Performed at [facility], [address], [place], [place] [address]    Report Status PENDING  Incomplete  Culture, blood (routine x 2)     Status: None (Preliminary result)   Collection Time: 12/09/24 11:10 PM   Specimen: BLOOD RIGHT HAND  Result Value Ref Range Status   Specimen Description   Final    BLOOD RIGHT HAND Performed at [facility], [address] [place], [place] [address]    Special Requests   Final    BOTTLES DRAWN AEROBIC AND ANAEROBIC Blood Culture adequate volume Performed at [facility], [address], [place], [place] [address]    Culture   Final    NO GROWTH 2 DAYS Performed at [facility], [address], [place], [place] [address]    Report Status PENDING  Incomplete  Gastrointestinal Panel by PCR , Stool     Status: Abnormal   Collection Time: 12/10/24  9:39 AM   Specimen: Stool  Result Value Ref Range Status   Campylobacter species NOT DETECTED NOT DETECTED Final   Plesimonas shigelloides NOT DETECTED NOT DETECTED Final   Salmonella species NOT DETECTED NOT DETECTED Final   Yersinia enterocolitica NOT DETECTED NOT DETECTED Final   Vibrio species NOT DETECTED NOT DETECTED Final   Vibrio cholerae NOT DETECTED NOT DETECTED Final   Enteroaggregative E coli (EAEC) NOT DETECTED NOT DETECTED Final   Enteropathogenic E coli (EPEC) NOT DETECTED NOT DETECTED Final  Enterotoxigenic E coli (ETEC) NOT DETECTED NOT DETECTED Final   Shiga like toxin producing E coli (STEC) NOT DETECTED NOT DETECTED Final   Shigella/Enteroinvasive E coli (EIEC) NOT DETECTED NOT DETECTED Final   Cryptosporidium NOT DETECTED NOT DETECTED Final   Cyclospora cayetanensis NOT DETECTED NOT DETECTED Final   Entamoeba histolytica NOT DETECTED NOT DETECTED Final   Giardia lamblia NOT DETECTED NOT DETECTED Final   Adenovirus F40/41 NOT DETECTED NOT DETECTED  Final   Astrovirus NOT DETECTED NOT DETECTED Final   Norovirus GI/GII DETECTED (A) NOT DETECTED Final    Comment: RESULT CALLED TO, READ BACK BY AND VERIFIED WITH:  VANESSA NAVARRO AT 2359 12/10/24 JG    Rotavirus A NOT DETECTED NOT DETECTED Final   Sapovirus (I, II, IV, and V) NOT DETECTED NOT DETECTED Final    Comment: Performed at [facility], [address] [place], [place] [address]  C Difficile Quick Screen w PCR reflex     Status: None   Collection Time: 12/10/24  9:39 AM   Specimen: Stool  Result Value Ref Range Status   C Diff antigen NEGATIVE NEGATIVE Final   C Diff toxin NEGATIVE NEGATIVE Final   C Diff interpretation No C. difficile detected.  Final    Comment: Performed at [facility], [address] [place], [place] [address]     Labs:   Basic Metabolic Panel: Recent Labs  Lab 12/09/24 2123 12/10/24 0329 12/11/24 0750 12/12/24 0509  NA 139 138 142 142  K 2.9* 3.6 3.0* 3.4*  CL 103 107 112* 112*  CO2 23 20* 22 23  GLUCOSE 172* 161* 109* 99  BUN 23 20 6* 7*  CREATININE 0.96 0.83 0.67 0.68  CALCIUM  10.7* 9.0 8.3* 8.9  MG 2.3 2.2 1.9  --   PHOS  --  3.1  --   --    Liver Function Tests: Recent Labs  Lab 12/09/24 2123 12/10/24 0329 12/11/24 0750  AST 29 23 22   ALT 14 13 10   ALKPHOS 54 45 43  BILITOT 0.8 0.6 0.7  PROT 6.8 5.9* 5.5*  ALBUMIN 4.4 3.8 3.2*   Recent Labs  Lab 12/09/24 2123  LIPASE 39    CBC: Recent Labs  Lab 12/09/24 2123 12/10/24 0329 12/11/24 0750  WBC 10.8* 8.6 6.2  NEUTROABS 9.5*  --   --   HGB 12.9 11.4* 10.9*  HCT 36.8 32.5* 32.0*  MCV 87.8 88.6 89.9  PLT 308 253 241   Cardiac Enzymes: Recent Labs  Lab 12/10/24 0329  CKTOTAL 90    IMAGING STUDIES CT ABDOMEN PELVIS W CONTRAST Result Date: 12/09/2024 CLINICAL DATA:  Abdomen pain nausea vomiting diarrhea EXAM: CT ABDOMEN AND PELVIS WITH CONTRAST TECHNIQUE: Multidetector CT imaging of the abdomen and pelvis was performed using the  standard protocol following bolus administration of intravenous contrast. RADIATION DOSE REDUCTION: This exam was performed according to the departmental dose-optimization program which includes automated exposure control, adjustment of the mA and/or kV according to patient size and/or use of iterative reconstruction technique. CONTRAST:  OMNIPAQUE  IOHEXOL  300 MG/ML  SOLN COMPARISON:  CT 11/01/2022 FINDINGS: Lower chest: Lung bases demonstrate no acute consolidation or effusion. Hazy dependent atelectasis at the bases. Hepatobiliary: Distended gallbladder. No calcified stones or biliary dilatation Pancreas: Unremarkable. No pancreatic ductal dilatation or surrounding inflammatory changes. Spleen: Normal in size without focal abnormality. Adrenals/Urinary Tract: Adrenal glands are normal. Kidneys show no hydronephrosis. The bladder is normal. Cyst in the left kidney, no imaging follow-up  is recommended. Stomach/Bowel: The stomach is within normal limits. No dilated small bowel. Some fluid-filled small bowel in the pelvis. Mild wall thickening involving the ascending colon, hepatic flexure and proximal transverse colon with mucosal enhancement. Marked circumferential wall thickening involving the descending and sigmoid colon, similar pattern as compared with prior exam from 2023. Large amount of stool within the colon. Negative appendix. Moderate fecal retention at the rectum Vascular/Lymphatic: Aortic atherosclerosis. No enlarged abdominal or pelvic lymph nodes. Reproductive: Uterus and bilateral adnexa are unremarkable. Other: Negative for free air. Small volume free fluid in the pelvis. Musculoskeletal: No acute or suspicious osseous abnormality IMPRESSION: 1. Marked circumferential wall thickening involving the descending and sigmoid colon, similar pattern as compared with prior exam from 2023. Mild wall thickening involving the ascending colon, hepatic flexure and proximal transverse colon with mucosal  enhancement. Overall findings are suspected to represent colitis of infectious or inflammatory etiology. However given similar distribution of findings at the left colon as compared with the exam from 2023, advise colonoscopy follow-up after resolution of acute symptoms, if not already performed. 2. Large amount of stool within the colon with moderate fecal retention at the rectum. 3. Aortic atherosclerosis. Aortic Atherosclerosis (ICD10-I70.0). Electronically Signed   By: Luke Bun M.D.   On: 12/09/2024 23:53    DISCHARGE EXAMINATION: Vitals:   12/10/24 2220 12/11/24 0540 12/11/24 2100 12/12/24 0534  BP:  115/75 130/65 (!) 148/76  Pulse:  71 73 (!) 58  Resp: 18 19 15 16   Temp:  98.5 F (36.9 C) 99.1 F (37.3 C) 98.6 F (37 C)  TempSrc:  Oral Oral Oral  SpO2:  98% 97% 98%  Weight:      Height:       General appearance: Awake alert.  In no distress Resp: Clear to auscultation bilaterally.  Normal effort Cardio: S1-S2 is normal regular.  No S3-S4.  No rubs murmurs or bruit GI: Abdomen is soft.  Nontender nondistended.  Bowel sounds are present normal.  No masses organomegaly Extremities: No edema.  Full range of motion of lower extremities. Neurologic: Alert and oriented x3.  No focal neurological deficits.    DISPOSITION: Home  Discharge Instructions     Call MD for:  difficulty breathing, headache or visual disturbances   Complete by: As directed    Call MD for:  extreme fatigue   Complete by: As directed    Call MD for:  persistant dizziness or light-headedness   Complete by: As directed    Call MD for:  persistant nausea and vomiting   Complete by: As directed    Call MD for:  severe uncontrolled pain   Complete by: As directed    Call MD for:  temperature >100.4   Complete by: As directed    Discharge instructions   Complete by: As directed    Please take your medications as prescribed.  You may resume your hydrochlorothiazide from tomorrow.  Seek attention if your  symptoms recur.  You were cared for by a hospitalist during your hospital stay. If you have any questions about your discharge medications or the care you received while you were in the hospital after you are discharged, you can call the unit and asked to speak with the hospitalist on call if the hospitalist that took care of you is not available. Once you are discharged, your primary care physician will handle any further medical issues. Please note that NO REFILLS for any discharge medications will be authorized once you are discharged,  as it is imperative that you return to your primary care physician (or establish a relationship with a primary care physician if you do not have one) for your aftercare needs so that they can reassess your need for medications and monitor your lab values. If you do not have a primary care physician, you can call [phone] for a physician referral.   Increase activity slowly   Complete by: As directed          Allergies as of 12/12/2024       Reactions   Penicillins Rash        Medication List     TAKE these medications    alendronate 70 MG tablet Commonly known as: FOSAMAX Take 70 mg by mouth once a week.   atorvastatin  10 MG tablet Commonly known as: LIPITOR Take 10 mg by mouth daily.   hydrochlorothiazide 25 MG tablet Commonly known as: HYDRODIURIL Take 25 mg by mouth daily.   levothyroxine  50 MCG tablet Commonly known as: SYNTHROID  Take 50 mcg by mouth daily.   potassium chloride  SA 20 MEQ tablet Commonly known as: KLOR-CON  M Take 1 tablet (20 mEq total) by mouth daily.          Follow-up Information     Cleotilde Planas, MD. Call in 1 week(s).   Specialty: Family Medicine Contact information: [address] [place] [address] [phone]                 TOTAL DISCHARGE TIME: 35 minutes  Bennett Ram Verdene  [facility] Pager on [email or web address]  12/13/2024, 11:25 AM    "

## 2024-12-12 NOTE — Plan of Care (Signed)
  Problem: Health Behavior/Discharge Planning: Goal: Ability to manage health-related needs will improve Outcome: Progressing   Problem: Clinical Measurements: Goal: Ability to maintain clinical measurements within normal limits will improve Outcome: Progressing Goal: Will remain free from infection Outcome: Progressing Goal: Diagnostic test results will improve Outcome: Progressing   Problem: Activity: Goal: Risk for activity intolerance will decrease Outcome: Progressing   Problem: Nutrition: Goal: Adequate nutrition will be maintained Outcome: Progressing   Problem: Coping: Goal: Level of anxiety will decrease Outcome: Progressing   Problem: Elimination: Goal: Will not experience complications related to bowel motility Outcome: Progressing   Problem: Pain Managment: Goal: General experience of comfort will improve and/or be controlled Outcome: Progressing   Problem: Safety: Goal: Ability to remain free from injury will improve Outcome: Progressing

## 2024-12-12 NOTE — Progress Notes (Signed)
 Discharge instructions reviewed with patient, verbalized understanding. All questions answered. All belongings accounted for. Patient to follow up with MD in  1-2 weeks. PIV removed. Assisted via WC to private vehicle.   Discharge Medications delivered from TOC meds to bed [facility] outpatient pharmacy by this RN.

## 2024-12-12 NOTE — Progress Notes (Signed)
" °   12/12/24 0932  TOC Brief Assessment  Insurance and Status Reviewed  Patient has primary care physician Yes  Home environment has been reviewed Home with spouse  Prior level of function: Independent with ADL's  Prior/Current Home Services No current home services  Social Drivers of Health Review SDOH reviewed no interventions necessary  Readmission risk has been reviewed Yes  Transition of care needs no transition of care needs at this time    "

## 2024-12-17 ENCOUNTER — Encounter (HOSPITAL_COMMUNITY): Payer: Self-pay
